# Patient Record
Sex: Female | Born: 1990 | Race: Black or African American | Hispanic: No | Marital: Single | Smoking: Never smoker
Health system: Southern US, Community
[De-identification: ages and names within clinical notes are randomized; demographics above are authoritative.]

## PROBLEM LIST (undated history)

## (undated) ENCOUNTER — Inpatient Hospital Stay (HOSPITAL_COMMUNITY): Payer: Self-pay

## (undated) DIAGNOSIS — Z789 Other specified health status: Secondary | ICD-10-CM

## (undated) DIAGNOSIS — O039 Complete or unspecified spontaneous abortion without complication: Secondary | ICD-10-CM

## (undated) HISTORY — DX: Complete or unspecified spontaneous abortion without complication: O03.9

## (undated) HISTORY — PX: NO PAST SURGERIES: SHX2092

## (undated) HISTORY — PX: CERVICAL CERCLAGE: SHX1329

---
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
## 2008-03-22 ENCOUNTER — Inpatient Hospital Stay (HOSPITAL_COMMUNITY): Admission: AD | Admit: 2008-03-22 | Discharge: 2008-03-23 | Payer: Self-pay | Admitting: Obstetrics & Gynecology

## 2008-04-17 ENCOUNTER — Inpatient Hospital Stay (HOSPITAL_COMMUNITY): Admission: AD | Admit: 2008-04-17 | Discharge: 2008-04-17 | Payer: Self-pay | Admitting: Obstetrics and Gynecology

## 2008-05-05 ENCOUNTER — Observation Stay (HOSPITAL_COMMUNITY): Admission: AD | Admit: 2008-05-05 | Discharge: 2008-05-06 | Payer: Self-pay | Admitting: Obstetrics and Gynecology

## 2008-05-15 ENCOUNTER — Encounter (INDEPENDENT_AMBULATORY_CARE_PROVIDER_SITE_OTHER): Payer: Self-pay | Admitting: Obstetrics and Gynecology

## 2008-05-15 ENCOUNTER — Inpatient Hospital Stay (HOSPITAL_COMMUNITY): Admission: AD | Admit: 2008-05-15 | Discharge: 2008-05-15 | Payer: Self-pay | Admitting: Obstetrics and Gynecology

## 2008-05-18 ENCOUNTER — Inpatient Hospital Stay (HOSPITAL_COMMUNITY): Admission: AD | Admit: 2008-05-18 | Discharge: 2008-05-18 | Payer: Self-pay | Admitting: Obstetrics and Gynecology

## 2008-08-10 ENCOUNTER — Inpatient Hospital Stay (HOSPITAL_COMMUNITY): Admission: AD | Admit: 2008-08-10 | Discharge: 2008-08-11 | Payer: Self-pay | Admitting: Obstetrics

## 2008-08-23 ENCOUNTER — Inpatient Hospital Stay (HOSPITAL_COMMUNITY): Admission: AD | Admit: 2008-08-23 | Discharge: 2008-08-23 | Payer: Self-pay | Admitting: Obstetrics

## 2008-08-25 ENCOUNTER — Inpatient Hospital Stay (HOSPITAL_COMMUNITY): Admission: AD | Admit: 2008-08-25 | Discharge: 2008-08-25 | Payer: Self-pay | Admitting: Obstetrics

## 2008-09-06 ENCOUNTER — Inpatient Hospital Stay (HOSPITAL_COMMUNITY): Admission: AD | Admit: 2008-09-06 | Discharge: 2008-09-06 | Payer: Self-pay | Admitting: Obstetrics

## 2008-09-07 ENCOUNTER — Ambulatory Visit (HOSPITAL_COMMUNITY): Admission: RE | Admit: 2008-09-07 | Discharge: 2008-09-07 | Payer: Self-pay | Admitting: Obstetrics

## 2008-09-14 ENCOUNTER — Observation Stay (HOSPITAL_COMMUNITY): Admission: AD | Admit: 2008-09-14 | Discharge: 2008-09-15 | Payer: Self-pay | Admitting: Obstetrics

## 2008-09-24 ENCOUNTER — Inpatient Hospital Stay (HOSPITAL_COMMUNITY): Admission: AD | Admit: 2008-09-24 | Discharge: 2008-09-24 | Payer: Self-pay | Admitting: Obstetrics

## 2008-10-10 ENCOUNTER — Inpatient Hospital Stay (HOSPITAL_COMMUNITY): Admission: AD | Admit: 2008-10-10 | Discharge: 2008-10-10 | Payer: Self-pay | Admitting: Obstetrics

## 2008-10-20 ENCOUNTER — Inpatient Hospital Stay (HOSPITAL_COMMUNITY): Admission: AD | Admit: 2008-10-20 | Discharge: 2008-10-20 | Payer: Self-pay | Admitting: Obstetrics

## 2008-10-25 ENCOUNTER — Inpatient Hospital Stay (HOSPITAL_COMMUNITY): Admission: AD | Admit: 2008-10-25 | Discharge: 2008-10-25 | Payer: Self-pay | Admitting: Obstetrics

## 2008-10-30 ENCOUNTER — Inpatient Hospital Stay (HOSPITAL_COMMUNITY): Admission: AD | Admit: 2008-10-30 | Discharge: 2008-11-15 | Payer: Self-pay | Admitting: Obstetrics

## 2008-10-30 ENCOUNTER — Ambulatory Visit: Payer: Self-pay | Admitting: Advanced Practice Midwife

## 2008-11-13 ENCOUNTER — Encounter: Payer: Self-pay | Admitting: Obstetrics & Gynecology

## 2009-10-07 IMAGING — US US ABDOMEN COMPLETE
1 series · 14 of 25 positions shown · non-contrast
Comparison: None

CLINICAL DATA: 14 weeks gestational age.  Food poisoning or stomach
virus.  Nausea, vomiting.  Rule out gallbladder.  Abdominal pain.

ABDOMEN ULTRASOUND
TECHNIQUE: Complete abdominal ultrasound examination was performed
including evaluation of the liver, gallbladder, bile ducts,
pancreas, kidneys, spleen, IVC, and abdominal aorta.

[Series 1: us abdomen complete · 14 of 56 slices shown]
[im 1/56]
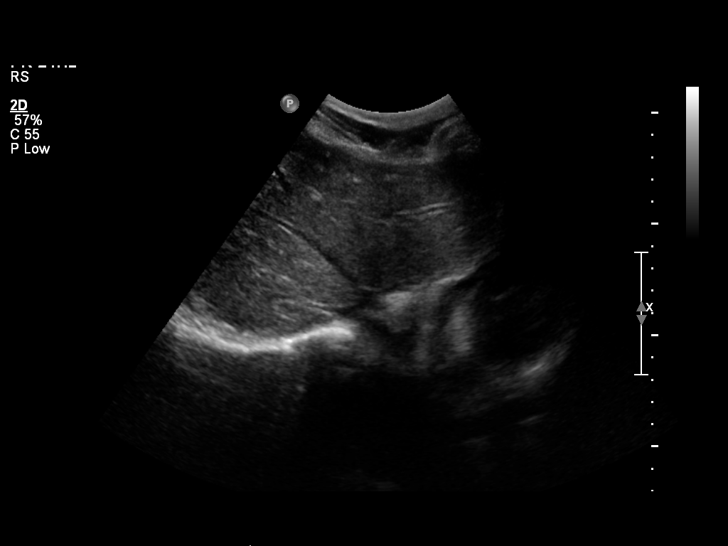
[im 5/56]
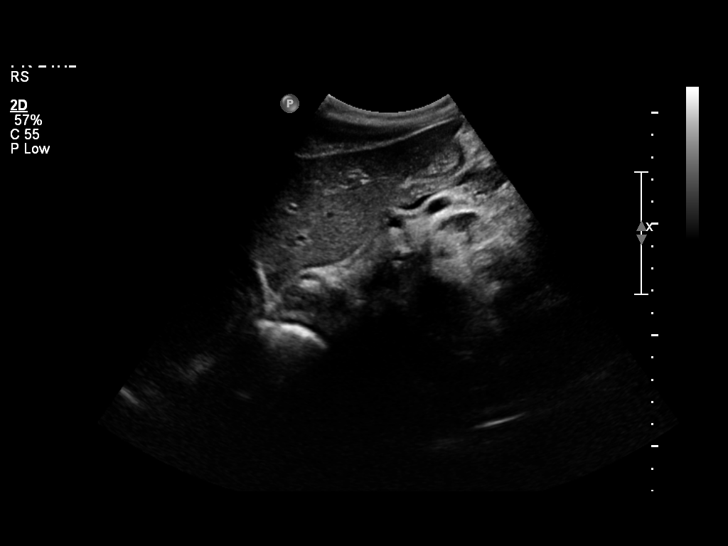
[im 10/56]
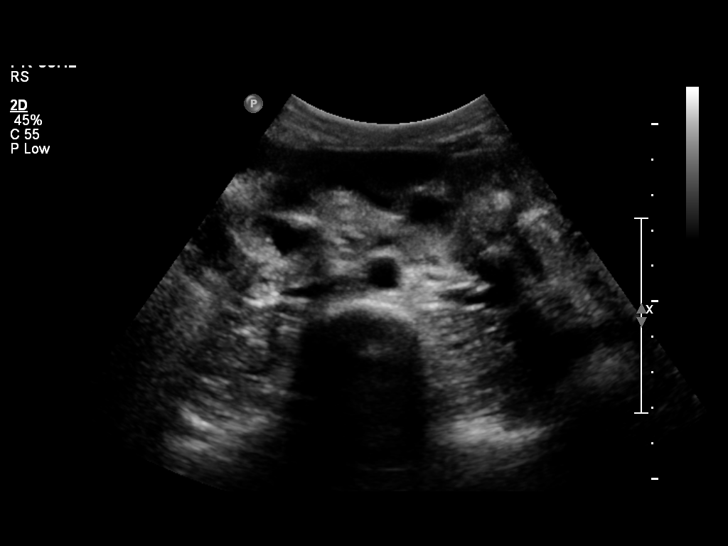
[im 14/56]
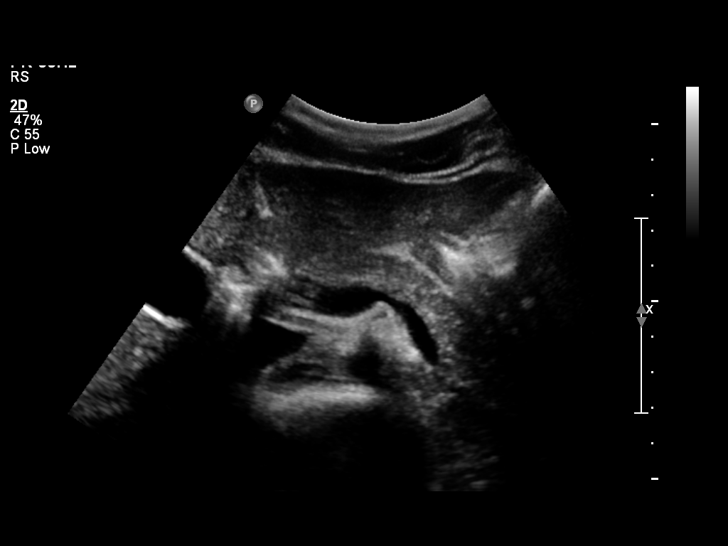
[im 19/56]
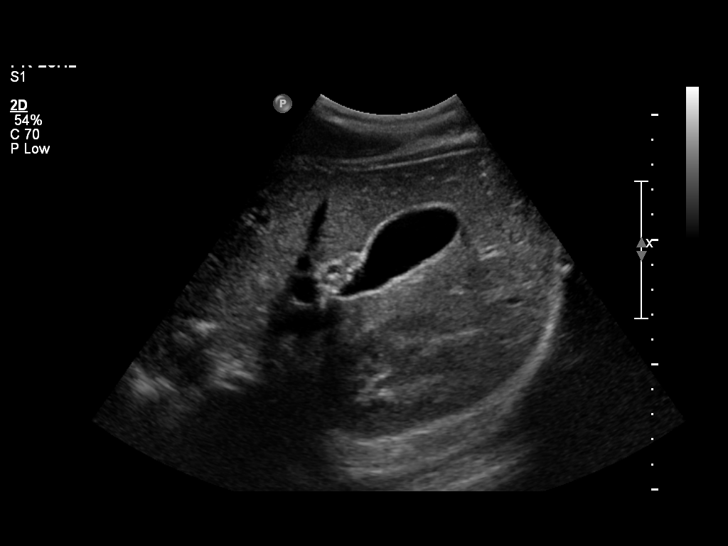
[im 21/56]
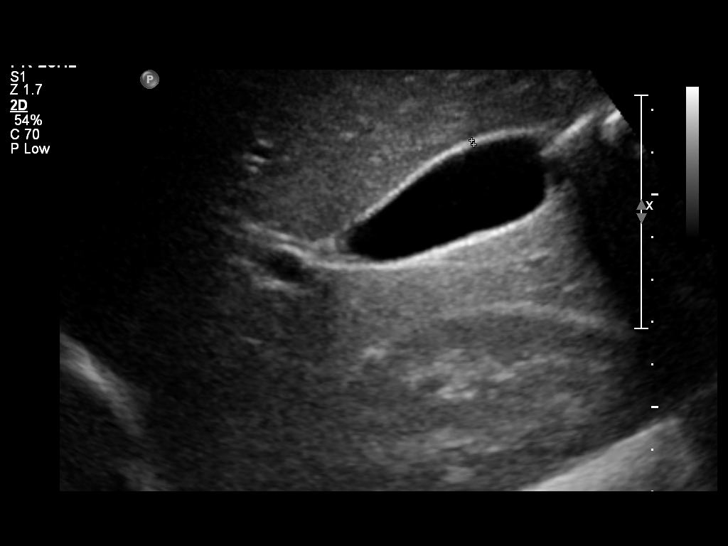
[im 26/56]
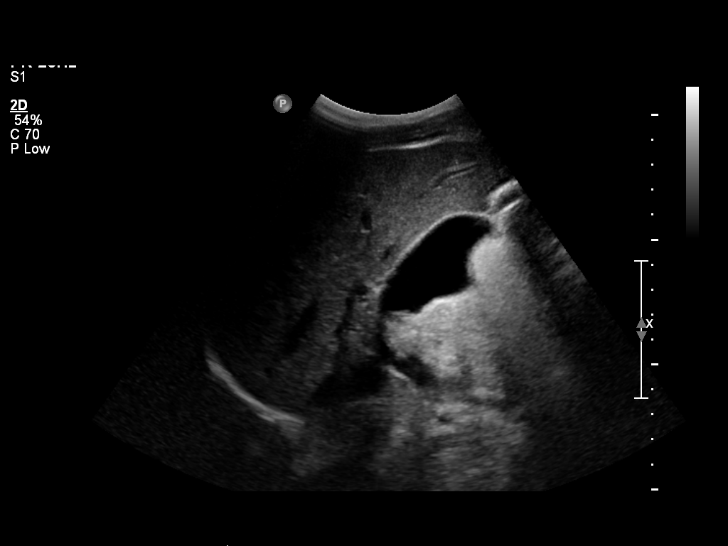
[im 30/56]
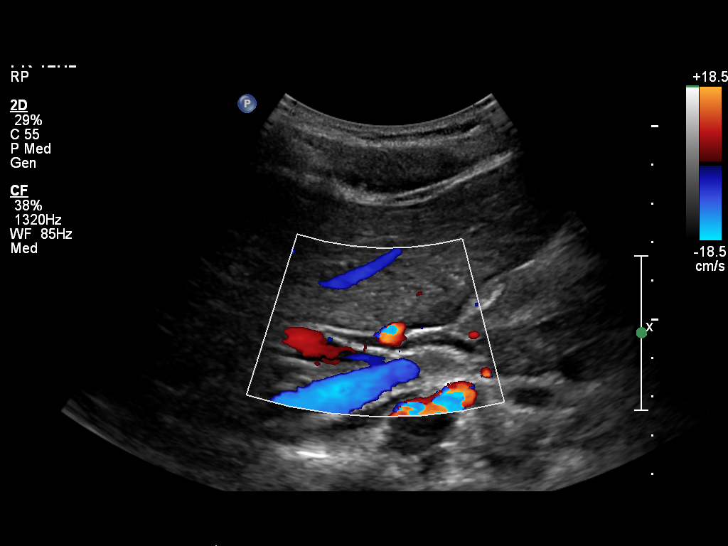
[im 35/56]
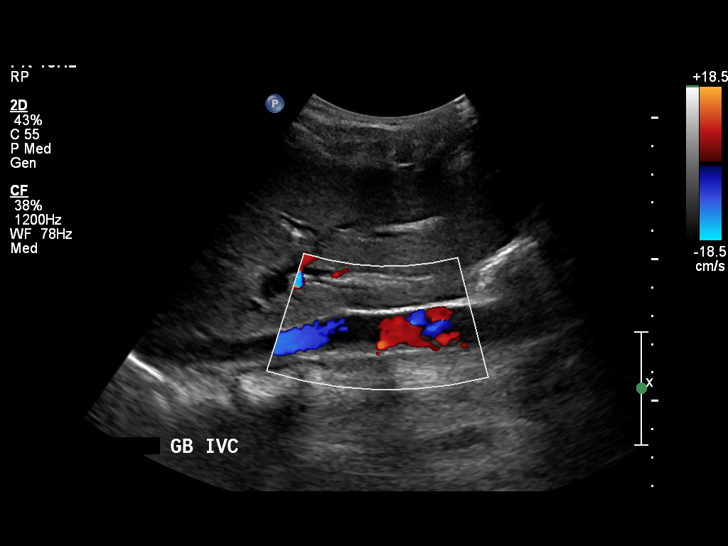
[im 37/56]
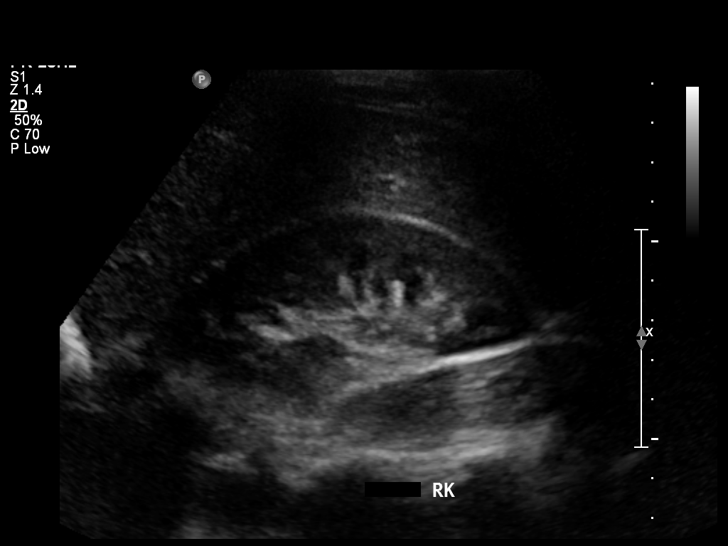
[im 42/56]
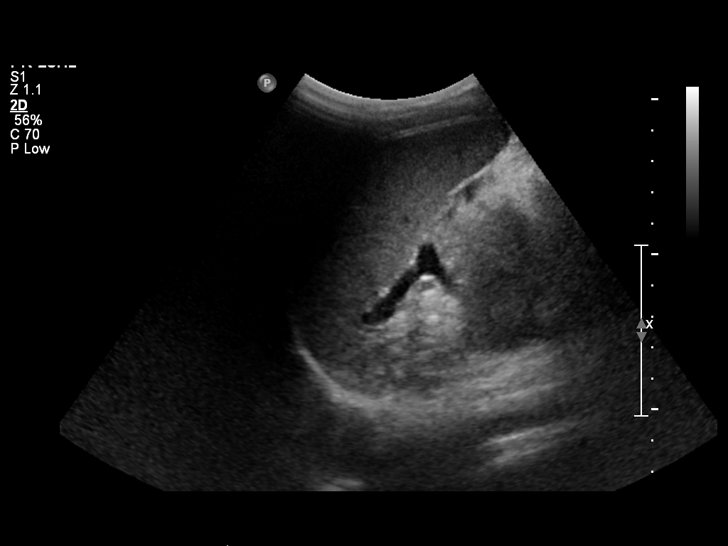
[im 46/56]
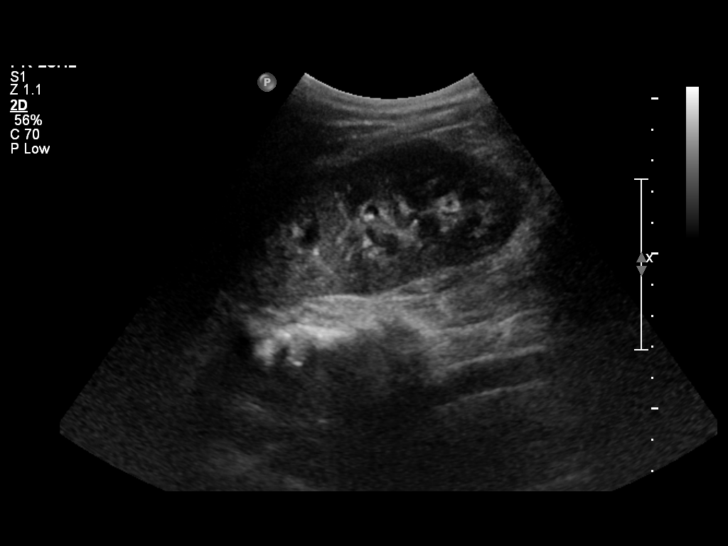
[im 51/56]
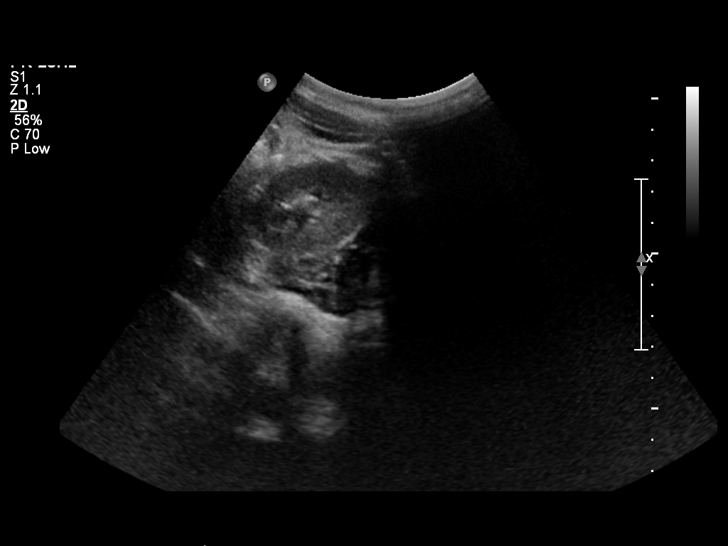
[im 56/56]
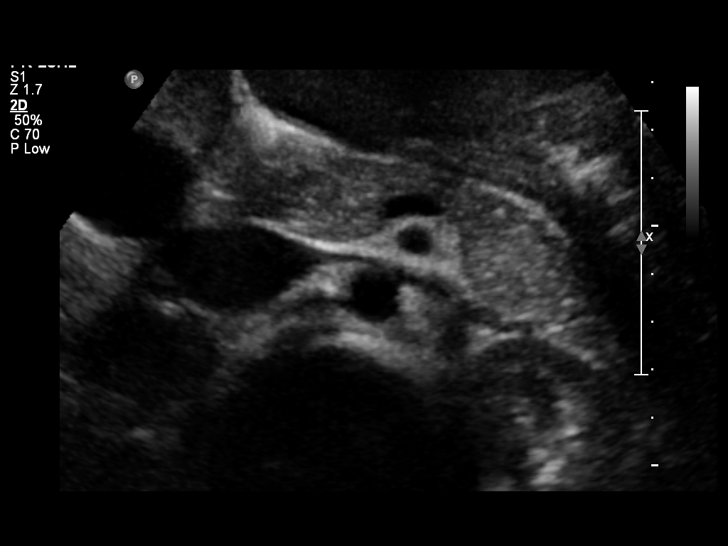

[14 of 25 positions shown; findings below may reference images not displayed]

FINDINGS: The liver is homogeneous in echotexture without mass.
There is no intra or extrahepatic biliary ductal dilatation.  The
gallbladder has a normal appearance.

The inferior vena cava and visualized abdominal aorta have normal
appearance.  The pancreas, spleen, and kidneys have normal
appearance.  No hydronephrosis is noted.
IMPRESSION: No evidence for acute abnormality of the abdomen.

## 2009-11-21 IMAGING — US US OB TRANSVAGINAL
1 series · 14 of 19 positions shown · non-contrast
Comparison: none

OBSTETRICAL ULTRASOUND:
 This ultrasound exam was performed in the [HOSPITAL] Ultrasound Department.  The OB US report was generated in the AS system, and faxed to the ordering physician.  This report is also available in [REDACTED] PACS.

[Series 1: us ob transvaginal · 14 of 19 slices shown]
[im 1/19]
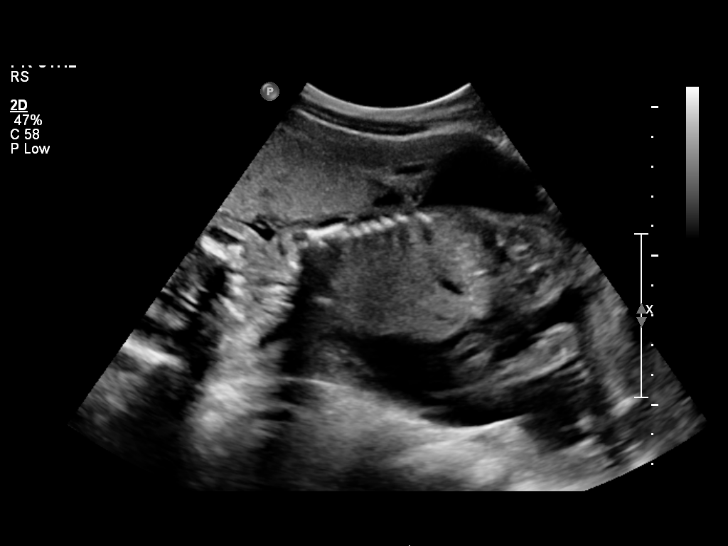
[im 3/19]
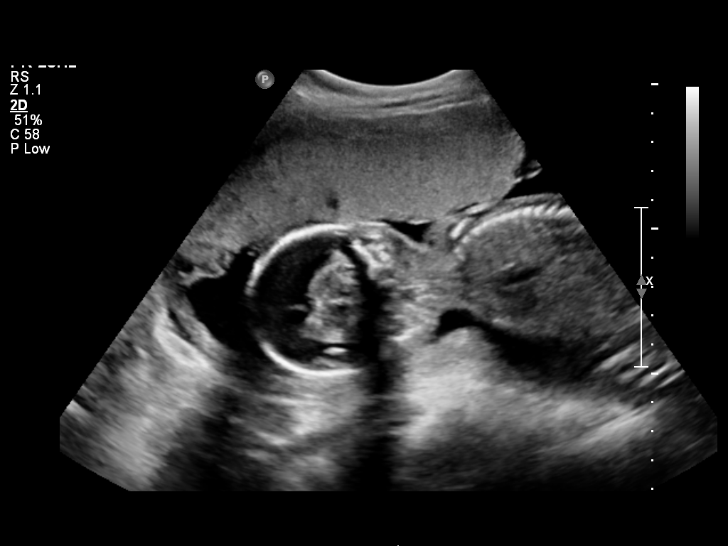
[im 4/19]
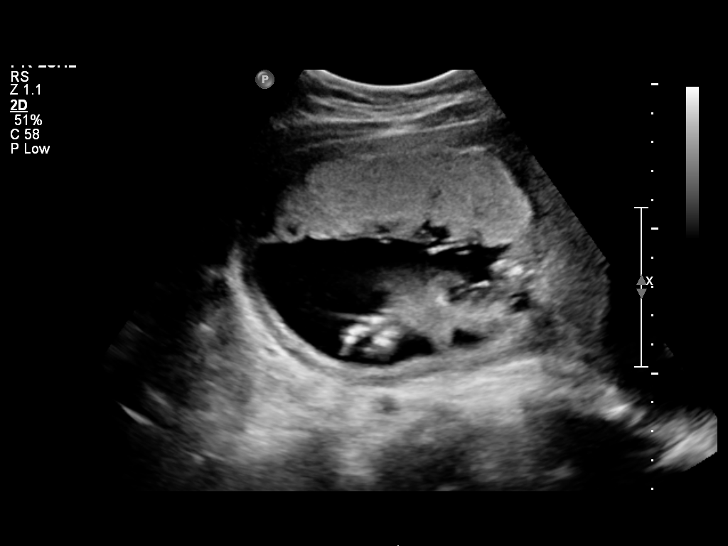
[im 5/19]
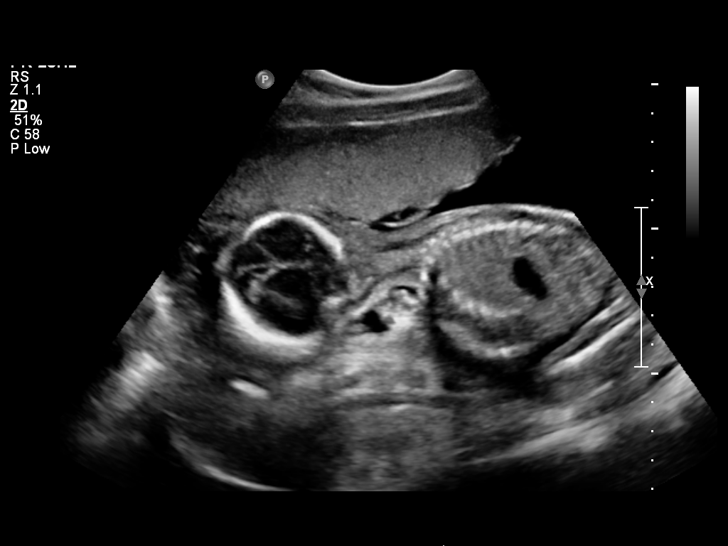
[im 7/19]
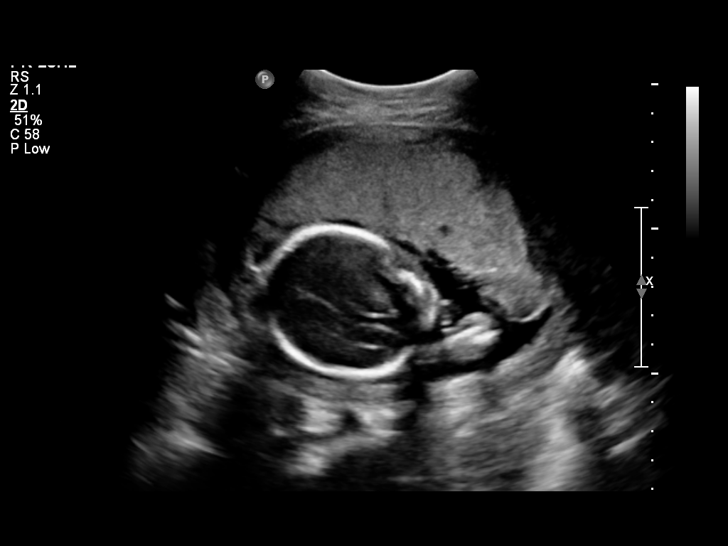
[im 8/19]
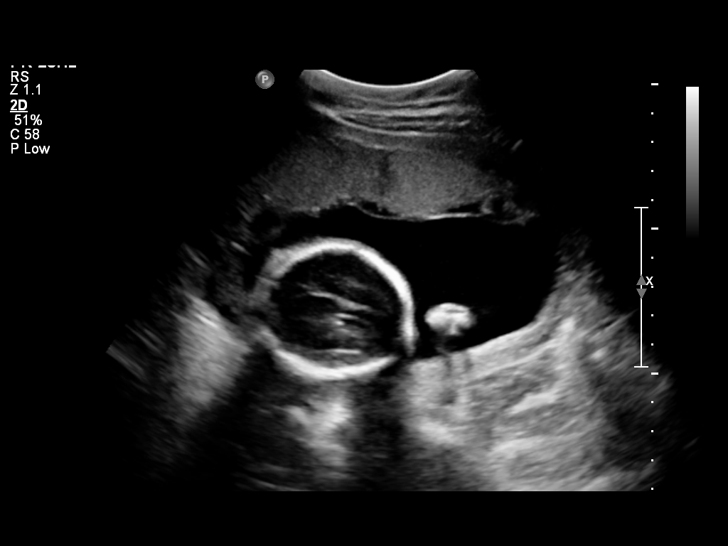
[im 9/19]
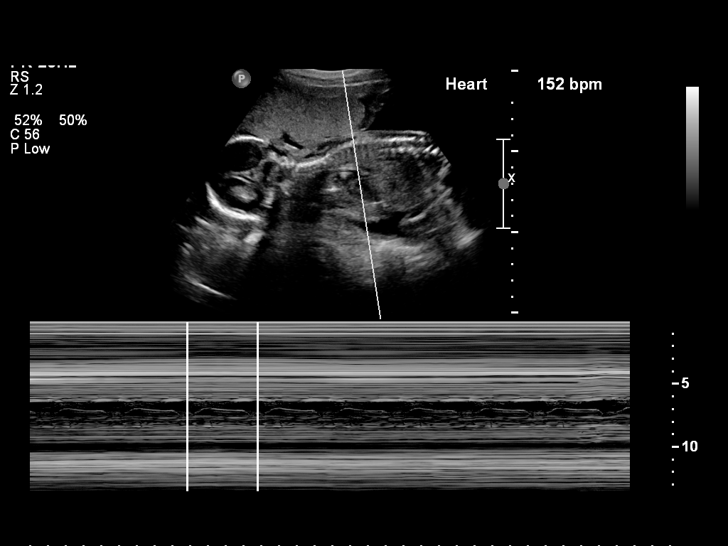
[im 11/19]
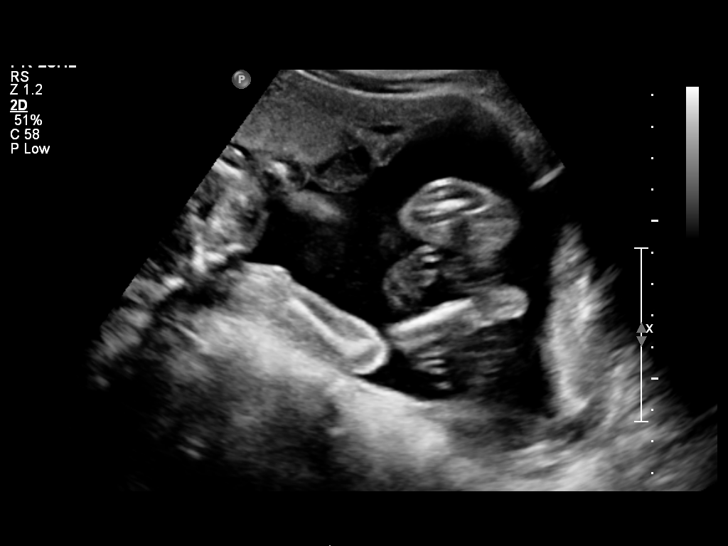
[im 12/19]
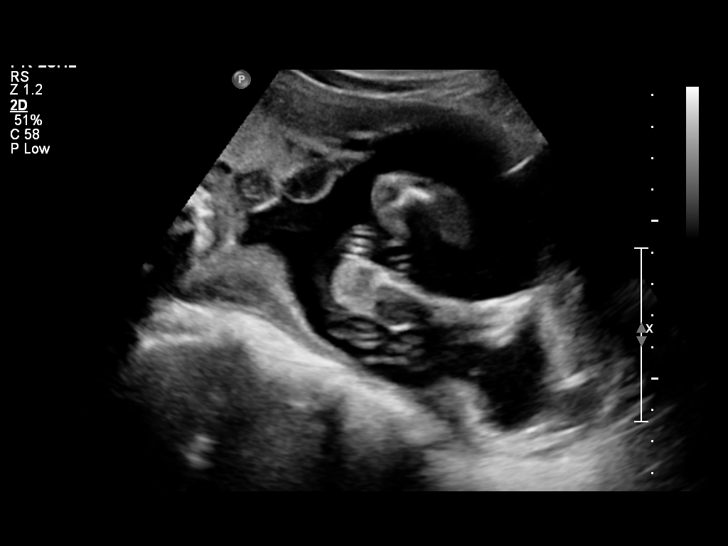
[im 13/19]
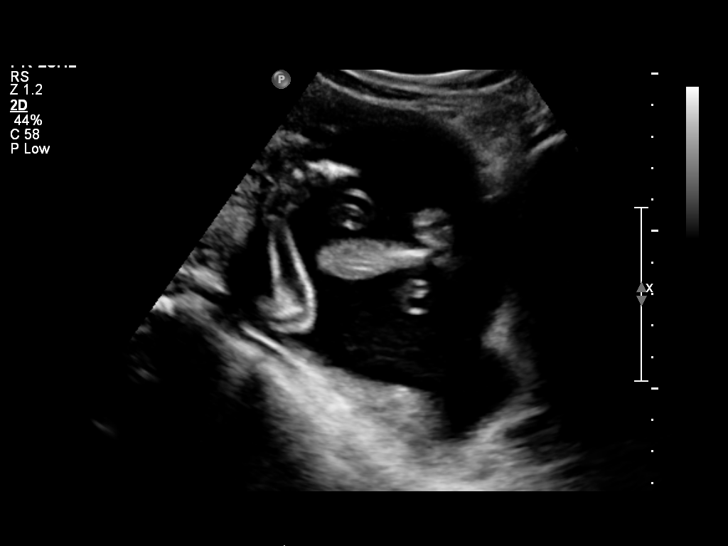
[im 15/19]
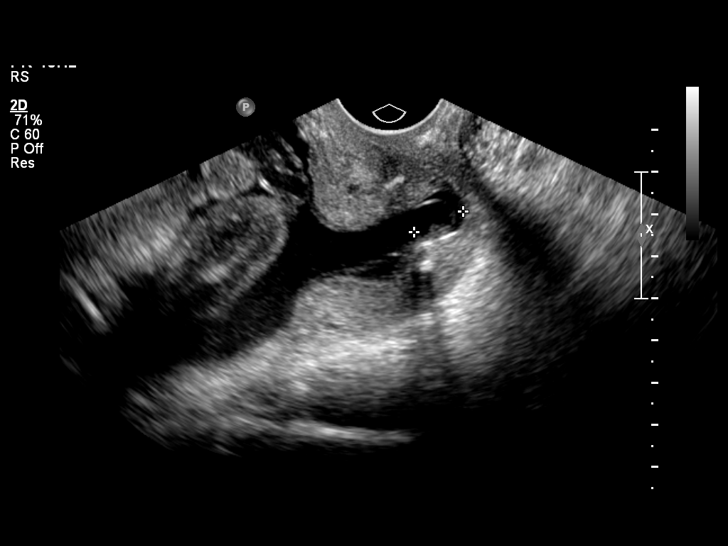
[im 16/19]
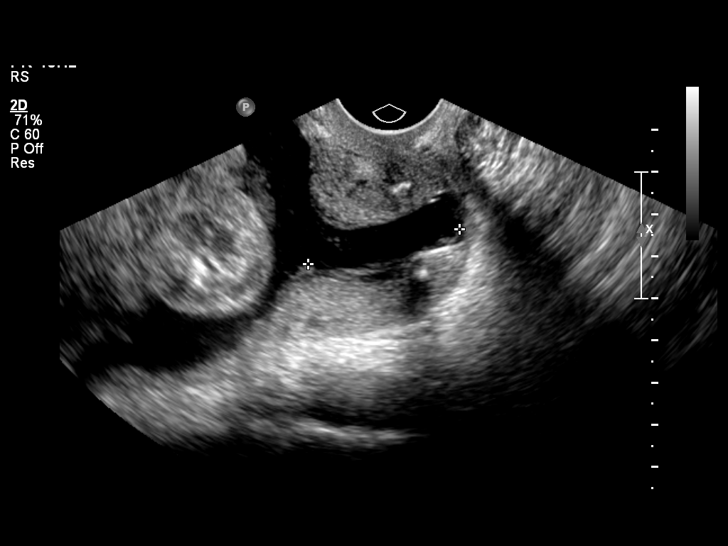
[im 17/19]
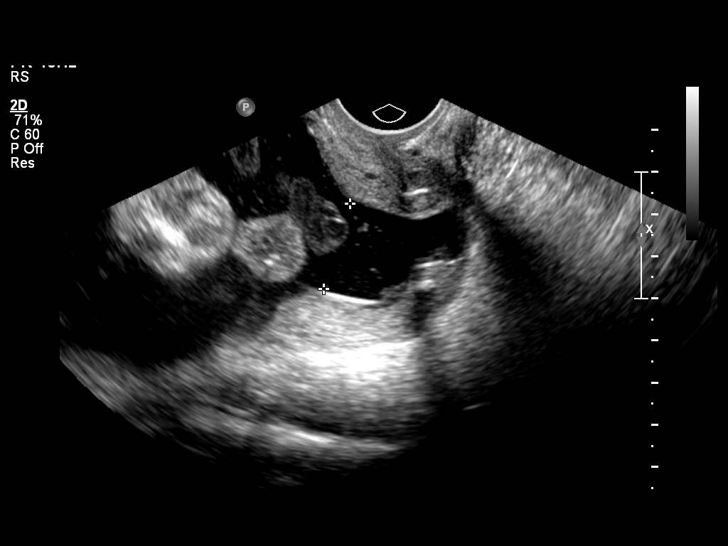
[im 19/19]
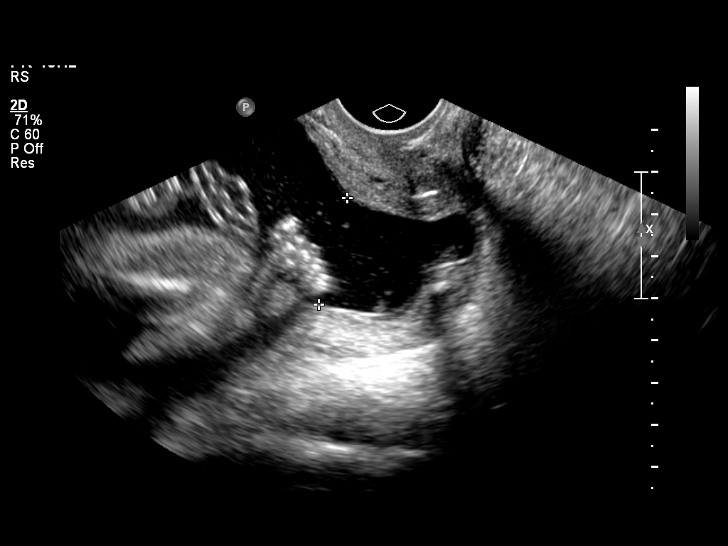

[14 of 19 positions shown; findings below may reference images not displayed]

IMPRESSION: See AS Obstetric US report.

## 2010-02-14 ENCOUNTER — Emergency Department (HOSPITAL_COMMUNITY): Admission: EM | Admit: 2010-02-14 | Discharge: 2010-02-14 | Payer: Self-pay | Admitting: Family Medicine

## 2010-02-22 ENCOUNTER — Emergency Department (HOSPITAL_COMMUNITY): Admission: EM | Admit: 2010-02-22 | Discharge: 2010-02-22 | Payer: Self-pay | Admitting: Emergency Medicine

## 2010-03-22 ENCOUNTER — Emergency Department (HOSPITAL_COMMUNITY): Admission: EM | Admit: 2010-03-22 | Discharge: 2010-03-22 | Payer: Self-pay | Admitting: Family Medicine

## 2010-12-02 LAB — WET PREP, GENITAL: Clue Cells Wet Prep HPF POC: NONE SEEN

## 2010-12-02 LAB — POCT PREGNANCY, URINE: Preg Test, Ur: NEGATIVE

## 2010-12-02 LAB — POCT URINALYSIS DIP (DEVICE)
Ketones, ur: NEGATIVE mg/dL
Protein, ur: NEGATIVE mg/dL
Specific Gravity, Urine: 1.02 (ref 1.005–1.030)

## 2010-12-03 LAB — POCT URINALYSIS DIP (DEVICE)
Glucose, UA: NEGATIVE mg/dL
Hgb urine dipstick: NEGATIVE
Nitrite: NEGATIVE
Protein, ur: NEGATIVE mg/dL
Urobilinogen, UA: 0.2 mg/dL (ref 0.0–1.0)
pH: 5.5 (ref 5.0–8.0)

## 2010-12-03 LAB — WET PREP, GENITAL
Clue Cells Wet Prep HPF POC: NONE SEEN
Clue Cells Wet Prep HPF POC: NONE SEEN
Yeast Wet Prep HPF POC: NONE SEEN

## 2010-12-03 LAB — URINE MICROSCOPIC-ADD ON

## 2010-12-03 LAB — GC/CHLAMYDIA PROBE AMP, GENITAL
Chlamydia, DNA Probe: NEGATIVE
Chlamydia, DNA Probe: NEGATIVE
GC Probe Amp, Genital: NEGATIVE

## 2010-12-03 LAB — URINALYSIS, ROUTINE W REFLEX MICROSCOPIC
Nitrite: NEGATIVE
Protein, ur: NEGATIVE mg/dL
Specific Gravity, Urine: 1.028 (ref 1.005–1.030)
pH: 6 (ref 5.0–8.0)

## 2010-12-03 LAB — POCT PREGNANCY, URINE
Preg Test, Ur: NEGATIVE
Preg Test, Ur: NEGATIVE

## 2010-12-03 LAB — RPR
RPR Ser Ql: NONREACTIVE
RPR Ser Ql: NONREACTIVE

## 2010-12-27 LAB — CBC
Hemoglobin: 11 g/dL — ABNORMAL LOW (ref 12.0–15.0)
MCHC: 32.7 g/dL (ref 30.0–36.0)
MCV: 79.2 fL (ref 78.0–100.0)
RBC: 4.23 MIL/uL (ref 3.87–5.11)
RDW: 14.5 % (ref 11.5–15.5)

## 2010-12-31 LAB — URINALYSIS, ROUTINE W REFLEX MICROSCOPIC
Bilirubin Urine: NEGATIVE
Glucose, UA: NEGATIVE mg/dL
Glucose, UA: NEGATIVE mg/dL
Hgb urine dipstick: NEGATIVE
Hgb urine dipstick: NEGATIVE
Ketones, ur: NEGATIVE mg/dL
Protein, ur: NEGATIVE mg/dL
Specific Gravity, Urine: 1.01 (ref 1.005–1.030)
Urobilinogen, UA: 0.2 mg/dL (ref 0.0–1.0)
Urobilinogen, UA: 0.2 mg/dL (ref 0.0–1.0)
pH: 8 (ref 5.0–8.0)

## 2010-12-31 LAB — WET PREP, GENITAL
Clue Cells Wet Prep HPF POC: NONE SEEN
Trich, Wet Prep: NONE SEEN
Yeast Wet Prep HPF POC: NONE SEEN

## 2010-12-31 LAB — GC/CHLAMYDIA PROBE AMP, GENITAL: GC Probe Amp, Genital: NEGATIVE

## 2011-01-01 LAB — CBC
HCT: 33.2 % — ABNORMAL LOW (ref 36.0–46.0)
MCHC: 32.7 g/dL (ref 30.0–36.0)
MCV: 78.8 fL (ref 78.0–100.0)
Platelets: 202 10*3/uL (ref 150–400)
WBC: 11.2 10*3/uL — ABNORMAL HIGH (ref 4.0–10.5)

## 2011-01-01 LAB — DIFFERENTIAL
Basophils Relative: 1 % (ref 0–1)
Eosinophils Absolute: 0.1 10*3/uL (ref 0.0–0.7)
Eosinophils Relative: 1 % (ref 0–5)
Lymphs Abs: 1.9 10*3/uL (ref 0.7–4.0)
Neutrophils Relative %: 75 % (ref 43–77)

## 2011-01-01 LAB — WET PREP, GENITAL
Trich, Wet Prep: NONE SEEN
Yeast Wet Prep HPF POC: NONE SEEN

## 2011-01-01 LAB — MAGNESIUM: Magnesium: 7 mg/dL (ref 1.5–2.5)

## 2011-01-29 NOTE — Op Note (Signed)
[name], [name]                 ACCOUNT NO.:  [id number]   MEDICAL RECORD NO.:  [id number]          PATIENT TYPE:  INP   LOCATION:  9320                          FACILITY:  [facility]   PHYSICIAN:  Roseanna Rainbow, M.D.DATE OF BIRTH:  Jul 25, 1991   DATE OF PROCEDURE:  11/13/2008  DATE OF DISCHARGE:                               OPERATIVE REPORT   PREOPERATIVE DIAGNOSES:  Intrauterine pregnancy at 23 plus weeks with  cerclage in situ, advanced preterm labor.   POSTOPERATIVE DIAGNOSES:  Intrauterine pregnancy at 23 plus weeks with  cerclage in situ, advanced preterm labor.   PROCEDURE:  Cerclage removal under anesthesia.   SURGEON:  Roseanna Rainbow, MD   ANESTHESIA:  Epidural.   IV FLUID:  As per Anesthesiology.   COMPLICATIONS:  None.   ESTIMATED BLOOD LOSS:  100 mL.   FINDINGS:  The cerclage knot was well visualized at 5 o'clock.   PROCEDURE:  The patient was taken to the operating room with an IV  running.  An epidural catheter was placed.  After a time-out had been  completed, a weighted speculum was placed in the patient's vagina.  The  membranes were hourglassing in the os. Deaver was placed into the  vagina.  A sponge forceps with throat pack on the end was then used to  push up on the membranes.  The patient was also placed  on  Trendelenburg.  The cerclage knot was identified at 5 o'clock.  It was  grasped superiorly and  the loop was transected.  A digital exam was  then performed, the cervix was approximately 5 cm dilated with the  vertex at the os.  The cervix was palpated.  No Mersilene tape  was detected and it was felt the cerclage had been completely removed.  All the instruments were then removed from the vagina.  Please note that  the heart rate had been auscultated with the external monitor before and  after the procedure and were in the 140 beats per minute range.      Roseanna Rainbow, M.D.  Electronically Signed     LAJ/MEDQ  D:   11/13/2008  T:  11/14/2008  Job:  [id number]   cc:   Kathreen Cosier, M.D.  Fax: [phone]

## 2011-01-29 NOTE — Op Note (Signed)
NAME:  [name], [name]                 ACCOUNT NO.:  [id number]   MEDICAL RECORD NO.:  [id number]          PATIENT TYPE:  AMB   LOCATION:  SDC                           [facility]ILITY:  [facility]   PHYSICIAN:  Kathreen Cosier, M.D.DATE OF BIRTH:  15-Sep-1991   DATE OF PROCEDURE:  09/07/2008  DATE OF DISCHARGE:                               OPERATIVE REPORT   PREOPERATIVE DIAGNOSIS:  Incompetent cervix.   PROCEDURE:  Placement of a cervical cerclage.   After the spinal was administered with the patient in lithotomy  position, perineum and vagina prepped and draped.  Bladder emptied with  straight catheter.  Weighted speculum placed in the vagina and starting  at 12 o'clock using a #5 Mersilene band, suture was placed and then tied  and 5 o'clock.  The patient tolerated the procedure well and taken to  recovery room in good condition.           ______________________________  Kathreen Cosier, M.D.     BAM/MEDQ  D:  09/07/2008  T:  09/07/2008  Job:  [id number]

## 2011-01-29 NOTE — Discharge Summary (Signed)
[name], [name]                 ACCOUNT NO.:  [id number]   MEDICAL RECORD NO.:  [id number]          PATIENT TYPE:  INP   LOCATION:  9318                          [facility]ILITY:  [facility]   PHYSICIAN:  Sherron Monday, MD        DATE OF BIRTH:  [birth year]   DATE OF ADMISSION:  05/15/2008  DATE OF DISCHARGE:  05/15/2008                               DISCHARGE SUMMARY   ADMITTING DIAGNOSES:  Intrauterine pregnancy at 20 weeks, failed  cerclage.   DISCHARGE DIAGNOSES:  Intrauterine pregnancy at 20 weeks, failed  cerclage, delivered via spontaneous vaginal delivery.   HISTORY OF PRESENT ILLNESS:  An 20 year old G2, P0-0-1-0, at 20 plus 2  weeks with cerclage with pain for approximately 2 hours similar to  menstrual cramping.  States she has had small spotting, good fetal  movement.  No loss of fluid.  A cerclage was placed May 06, 2008,  secondary to history of incompetent cervix and a shortening cervix.  Her  prenatal care to this point had been uncomplicated except by a previous  16-week PPROM and loss.   PAST MEDICAL HISTORY:  Nonsignificant.   PAST SURGICAL HISTORY:  Significant for the cerclage placement.   PAST OB/GYN HISTORY:  G1 16 week SAB secondary to incompetent cervix due  to its present pregnancy.  No history of any abnormal Pap smears,  sexually transmitted diseases including chlamydia with this pregnancy.   MEDICATIONS:  Include prenatal vitamins.   ALLERGIES:  No known drug allergies.   SOCIAL HISTORY:  The patient denies alcohol, tobacco, or drug use and is  single.   FAMILY HISTORY:  Significant for mother with hypertension.  No diabetes.  No cancer and no heart disease.   PHYSICAL EXAMINATION:  On admission, she is afebrile.  Vital signs  stable and very uncomfortable.  Her abdomen was soft, with  midline  cramping, and good bowel sounds on exam.  Normal external vulva and  genitalia.  Normal Bartholin, urethra, and Skene glands.  Cervix was  dilated at 2-4 cm and  membranes were noted at the cervical os with fetal  parts.   PRENATAL LABS:  Hemoglobin 12.5 and platelets 251,000.  B positive.  MSU  negative. Glucola within normal limits. Gonorrhea negative.  Chlamydia  negative.  RPR nonreactive.  Rubella immune.  Hepatitis B surface  antigen negative.  HIV negative.  Her first trimester screen within  limits.  On time of admission, urinalysis was negative.  Ultrasound that  had been performed at 19 weeks revealed normal anatomy, anterior  placenta, and a shortened cervix.  She was admitted and had a CBC  checked and revealed hemoglobin level of 11.5.  She was given  IV fluids  and pain medicine as she was having increasing pain.  Her cervix is  checked again at this time.  Membranes were noted to be bulging through  the cervical os, and in the vagina and the pregnancy was noted to be in  the bulging membranes.  She is given 4 mg of Morphine for discomfort and  Phenergan for nausea.  She  is also having nausea and vomiting.  I  described with the patient what was happening and the pregnancy was not  able to be continued.  Plan for expectant management.  The plan was to  clip her cerclage stitch however this was unable to be done prior to  delivery.  The patient at approximately 4:30 a.m. had increasing pain.  Membranes were noted at the os and into the vagina.  The patient pushed  for 3-4 times for delivery en caul, and placenta was then inspected,  thought to be complete and sent to pathology.  Apgars were noted to be  2, 1, and 1.  The delivery was at 4:48, death was at 6:02.  EBL less  than 500 mL.  No lacerations were noted.  Delivery was of a nonviable  female infant.  The patient requested an autopsy.  A second exam was  performed after delivery to remove the cerclage.  Of note, verified a  single stitch without difficulty or complications.  Of interest, the  cervix was not noted to have any lacerations.  Sutures removed in 1  piece.   Ultrasound was performed to verify no retained products.  As it  could not confirm retained products, the patient was given bleeding and  pain precautions.  She was discharged to home in the afternoon of  postpartum day #0 at her request.  Her bleeding was well controlled.  Her pain was well controlled.  She is discharged after a social work  consult.  She was discharged to home with the prescriptions for Motrin  and Vicodin, and she will follow up in the office in approximately 2-3  weeks.      Sherron Monday, MD  Electronically Signed     JB/MEDQ  D:  05/15/2008  T:  05/16/2008  Job:  [id number]

## 2011-01-29 NOTE — Op Note (Signed)
[name], [name]                 ACCOUNT NO.:  [id number]   MEDICAL RECORD NO.:  [id number]          PATIENT TYPE:  OBV   LOCATION:  9304                          [facility]ACILITY:  [facility]   PHYSICIAN:  Zenaida Niece, M.D.DATE OF BIRTH:  [birth year]   DATE OF PROCEDURE:  05/06/2008  DATE OF DISCHARGE:  05/05/2008                               OPERATIVE REPORT   PREOPERATIVE DIAGNOSIS:  Intrauterine pregnancy at 19 weeks with an  incompetent cervix.   POSTOPERATIVE DIAGNOSIS:  Intrauterine pregnancy at 19 weeks with an  incompetent cervix.   PROCEDURE:  McDonald cervical cerclage.   SURGEON:  Zenaida Niece, MD   ANESTHESIA:  Spinal.   FINDINGS:  Cervix was 1-2 cm dilated with 2-3 cm of cervical length.  Fetal membranes were visible approximately 1 cm above the external os.   SPECIMENS:  None.   ESTIMATED BLOOD LOSS:  50 mL.   COMPLICATIONS:  None.   PROCEDURE IN DETAIL:  The patient was taken to the operating room and  placed in a sitting position.  Dr. Malen Gauze and the CRNA student instilled  spinal anesthesia.  She was then placed in the dorsal supine position  and legs were placed in mobile stirrups.  Perineum was prepped with  Betadine and her previously placed Foley catheter was removed.  She was  draped sterilely.  A weighted speculum was inserted into the vagina and  the vagina was washed with warm saline solution.  A right-angle  retractor was used anteriorly.  Cervical exam revealed the above-  mentioned findings.  The cervix was grasped with ring forceps and a  cerclage of #1 Prolene was placed in a counterclockwise fashion starting  at 12 o'clock.  This achieved 5 bites of cervix going around  counterclockwise and left about a little bit more than a centimeter of  cervix.  This achieved good cervical closure when the knot was tied  anteriorly.  On exam after doing this, the cervix was closed and again  had 1-2 cm of length.  The cervix and vagina were then washed  again with  normal saline and all instruments were removed from the vagina.  The  patient tolerated the procedure well.  She was taken down from the  stirrups and taken to the recovery room in stable condition.  Counts  were correct and she received Ancef 1 g IV at the beginning of the  procedure.      Zenaida Niece, M.D.  Electronically Signed    TDM/MEDQ  D:  05/06/2008  T:  05/07/2008  Job:  [id number]

## 2011-01-29 NOTE — H&P (Signed)
[name], [name]                 ACCOUNT NO.:  [id number]   MEDICAL RECORD NO.:  [id number]          PATIENT TYPE:  OBV   LOCATION:  9304                          [facility]   PHYSICIAN:  Zenaida Niece, M.D.DATE OF BIRTH:  1991-01-25   DATE OF ADMISSION:  05/05/2008  DATE OF DISCHARGE:                              HISTORY & PHYSICAL   CHIEF COMPLAINT:  Incompetent cervix.   HISTORY OF PRESENT ILLNESS:  This is an 20 year old black female,  gravida 2, para 0-1-0-0 with an EGA of [redacted] weeks by an LMP, consistent  with an 8-week ultrasound with a due date of September 28, 2008.  The  patient started care here at 10 weeks.  Her previous pregnancy, the  patient reports she was approximately 16 weeks and had fluid leakage.  She was seen at the hospital and ruptured membranes were confirmed, and  she was 2-cm with fetal parts at the cervical os.  Review of her  hospital records reveal that she was 17 weeks.  We discussed the fact  that it was difficult to tell if this was due to an incompetent cervix  or something else.  She has been followed with serial ultrasounds to  check her cervix.  At 15 weeks, the cervix measured 4.7 cm, and her most  recent ultrasound prior to today was on April 29, 2008, and at that  visit the cervix still measured 4.17 cm.  However, on ultrasound today  for anatomy, her cervix measures 1.86 cm and is funneled to the external  os with membranes at the external os.  Exam is consistent with an  incompetent cervix, and she is being admitted for bedrest and cervical  cerclage.   PRENATAL LABS:  Blood type is B+ with a negative antibody screen.  RPR  nonreactive, rubella immune, hepatitis B surface antigen negative, HIV  negative, gonorrhea and chlamydia negative.   PAST OBSTETRICAL HISTORY:  Again, the preterm premature rupture of  membranes at 16-17 weeks, with delivery of a stillborn infant.   PAST MEDICAL HISTORY:  Otherwise negative.   PAST SURGICAL  HISTORY:  Negative.   ALLERGIES:  NONE KNOWN.   CURRENT MEDICATIONS:  None.   SOCIAL HISTORY:  Denies alcohol, tobacco or drug use.   FAMILY HISTORY:  Noncontributory.   PHYSICAL EXAMINATION:  VITAL SIGNS:  Weight is 125 pounds.  GENERAL:  This is a gravid female in no acute distress.  NECK:  Supple without lymphadenopathy or thyromegaly.  LUNGS:  Clear to auscultation.  HEART:  Regular rate and rhythm without murmur.  ABDOMEN:  Gravid, nontender.  Cervix is 1+30 and -3, and on speculum  exam, I can see cervical membranes at the external cervical os.   ASSESSMENT:  Intrauterine pregnancy at 19 weeks with a probable  incompetent cervix.  The situation been has been discussed in detail  with the patient.  Again, this is a significant change in her cervix  over the past week.  All options have been discussed with the patient.  Plan is to admit the patient and put her on bedrest.  She will have a  Foley catheter so that she does not have to get out of the bed to the go  to the bathroom.  We will briefly monitor her for contractions to make  sure that this is not an issue.  She will be on bedrest, and I will try  to get her in for a cerclage some time on May 06, 2008.      Zenaida Niece, M.D.  Electronically Signed     TDM/MEDQ  D:  05/05/2008  T:  05/05/2008  Job:  [id number]

## 2011-02-01 NOTE — Discharge Summary (Signed)
NAMEMarland Kitchen  [name], [name]                 ACCOUNT NO.:  [id number]   MEDICAL RECORD NO.:  [id number]          PATIENT TYPE:  INP   LOCATION:  9320                          [facility]ILITY[facility]   PHYSICIAN:  Kathreen Cosier, M.D.DATE OF BIRTH:  14-Dec-1990   DATE OF ADMISSION:  10/30/2008  DATE OF DISCHARGE:  11/15/2008                               DISCHARGE SUMMARY   The patient is a 20 year old gravida 3, para 0-0-2-0, admitted at 48  weeks, EDC on March 11, 2009.  She had a history of incompetent cervix  and had a cerclage in the past.  With this pregnancy, had a cerclage  also.  She was admitted because an ultrasound showed cervix with 0-cm  cervical length.  She was to found to have the length of the cervix and  the patient was admitted and kept in Trendelenburg, and got erythromycin  and ampicillin, and Delalutin 250 IM weekly.  She was also placed on  Procardia.  On November 12, 2008, she had occasional contractions and  she was started on magnesium sulfate 4 g loading, then 2 g and up to 3 g  per hour.  She had a cerclage was removed on November 13, 2008, and she  had a precipitous delivery vaginally of a 1 pound 5 ounces female, which  survived.  The patient did well post delivery and her hemoglobin was 11.  She was discharged on the second postpartum day on Tylenol #3 for pain.   DISCHARGE DIAGNOSES:  1. Status post incompetent cervix.  2. Failed cervical cerclage.  3. Normal vaginal delivery of 1 pound 5 ounce female.           ______________________________  Kathreen Cosier, M.D.     BAM/MEDQ  D:  12/07/2008  T:  12/07/2008  Job:  [id number]

## 2011-06-07 LAB — URINE MICROSCOPIC-ADD ON

## 2011-06-07 LAB — ANAEROBIC CULTURE

## 2011-06-07 LAB — URINALYSIS, ROUTINE W REFLEX MICROSCOPIC
Bilirubin Urine: NEGATIVE
Ketones, ur: NEGATIVE
Specific Gravity, Urine: 1.01
Urobilinogen, UA: 0.2
pH: 7

## 2011-06-07 LAB — CBC
HCT: 34.8 — ABNORMAL LOW
Hemoglobin: 12
RBC: 4.63

## 2011-06-07 LAB — SAMPLE TO BLOOD BANK

## 2011-06-13 LAB — URINALYSIS, ROUTINE W REFLEX MICROSCOPIC
Glucose, UA: NEGATIVE
Leukocytes, UA: NEGATIVE
Specific Gravity, Urine: <1.005 — ABNORMAL LOW
pH: 6

## 2011-06-13 LAB — URINE MICROSCOPIC-ADD ON

## 2011-06-13 LAB — GC/CHLAMYDIA PROBE AMP, GENITAL: GC Probe Amp, Genital: NEGATIVE

## 2011-06-13 LAB — WET PREP, GENITAL: Yeast Wet Prep HPF POC: NONE SEEN

## 2011-06-14 LAB — URINALYSIS, ROUTINE W REFLEX MICROSCOPIC
Hgb urine dipstick: NEGATIVE
Specific Gravity, Urine: 1.01
Urobilinogen, UA: 0.2

## 2011-06-19 LAB — URINE MICROSCOPIC-ADD ON

## 2011-06-19 LAB — CBC
HCT: 36.9
Hemoglobin: 11.8 — ABNORMAL LOW
RBC: 4.68
RDW: 14.1
WBC: 12.4 — ABNORMAL HIGH

## 2011-06-19 LAB — URINALYSIS, ROUTINE W REFLEX MICROSCOPIC
Specific Gravity, Urine: 1.015
Urobilinogen, UA: 0.2
pH: 6

## 2011-06-21 LAB — WET PREP, GENITAL
Clue Cells Wet Prep HPF POC: NONE SEEN
Yeast Wet Prep HPF POC: NONE SEEN

## 2011-06-21 LAB — CBC
Hemoglobin: 11.4 g/dL — ABNORMAL LOW (ref 12.0–15.0)
Platelets: 189 10*3/uL (ref 150–400)
Platelets: 195 10*3/uL (ref 150–400)
RDW: 13.5 % (ref 11.5–15.5)
WBC: 6.9 10*3/uL (ref 4.0–10.5)
WBC: 7.1 10*3/uL (ref 4.0–10.5)

## 2011-06-21 LAB — DIFFERENTIAL
Basophils Absolute: 0 10*3/uL (ref 0.0–0.1)
Eosinophils Relative: 0 % (ref 0–5)
Lymphocytes Relative: 4 % — ABNORMAL LOW (ref 12–46)
Monocytes Absolute: 0.5 10*3/uL (ref 0.1–1.0)

## 2011-06-21 LAB — URINALYSIS, ROUTINE W REFLEX MICROSCOPIC
Nitrite: NEGATIVE
Specific Gravity, Urine: >1.03 — ABNORMAL HIGH (ref 1.005–1.030)
Urobilinogen, UA: 0.2 mg/dL (ref 0.0–1.0)

## 2011-06-21 LAB — COMPREHENSIVE METABOLIC PANEL
AST: 22 U/L (ref 0–37)
Albumin: 2.7 g/dL — ABNORMAL LOW (ref 3.5–5.2)
Alkaline Phosphatase: 44 U/L (ref 39–117)
Chloride: 100 mEq/L (ref 96–112)
Creatinine, Ser: 0.63 mg/dL (ref 0.4–1.2)
GFR calc Af Amer: >60 mL/min (ref >60)
Potassium: 3.7 mEq/L (ref 3.5–5.1)
Sodium: 134 mEq/L — ABNORMAL LOW (ref 135–145)
Total Bilirubin: 0.6 mg/dL (ref 0.3–1.2)

## 2011-06-21 LAB — POCT PREGNANCY, URINE: Preg Test, Ur: POSITIVE

## 2011-06-21 LAB — AMYLASE: Amylase: 48 U/L (ref 27–131)

## 2011-08-17 DIAGNOSIS — O039 Complete or unspecified spontaneous abortion without complication: Secondary | ICD-10-CM

## 2011-08-17 HISTORY — DX: Complete or unspecified spontaneous abortion without complication: O03.9

## 2011-08-21 ENCOUNTER — Ambulatory Visit (INDEPENDENT_AMBULATORY_CARE_PROVIDER_SITE_OTHER): Payer: BC Managed Care – PPO

## 2011-08-21 DIAGNOSIS — Z3201 Encounter for pregnancy test, result positive: Secondary | ICD-10-CM

## 2011-08-21 DIAGNOSIS — N760 Acute vaginitis: Secondary | ICD-10-CM

## 2011-08-23 ENCOUNTER — Ambulatory Visit: Payer: BC Managed Care – PPO

## 2011-11-06 ENCOUNTER — Ambulatory Visit (INDEPENDENT_AMBULATORY_CARE_PROVIDER_SITE_OTHER): Payer: BC Managed Care – PPO | Admitting: Family Medicine

## 2011-11-06 VITALS — BP 100/64 | HR 80 | Temp 98.3°F | Resp 16 | Ht 65.0 in | Wt 127.0 lb

## 2011-11-06 DIAGNOSIS — K5900 Constipation, unspecified: Secondary | ICD-10-CM

## 2011-11-06 DIAGNOSIS — N898 Other specified noninflammatory disorders of vagina: Secondary | ICD-10-CM

## 2011-11-06 DIAGNOSIS — K921 Melena: Secondary | ICD-10-CM

## 2011-11-06 DIAGNOSIS — Z124 Encounter for screening for malignant neoplasm of cervix: Secondary | ICD-10-CM

## 2011-11-06 DIAGNOSIS — Z719 Counseling, unspecified: Secondary | ICD-10-CM

## 2011-11-06 DIAGNOSIS — K602 Anal fissure, unspecified: Secondary | ICD-10-CM

## 2011-11-06 LAB — POCT WET PREP WITH KOH

## 2011-11-06 MED ORDER — METRONIDAZOLE 500 MG PO TABS: 500.0000 mg | ORAL_TABLET | Freq: Two times a day (BID) | ORAL | Status: AC

## 2011-11-06 MED ORDER — FLUCONAZOLE 150 MG PO TABS: 150.0000 mg | ORAL_TABLET | Freq: Once | ORAL | Status: AC

## 2011-11-06 NOTE — Patient Instructions (Addendum)
Try a stool softener such as docusate sodium to keep your stool soft and avoid tears .  Drinking plenty of water can help too.  If you are considering a pregnancy in the next few months start your prenatal vitamin right away!  However, avoid the kind with iron as these may worsen your constipation.

## 2011-11-06 NOTE — Progress Notes (Signed)
Patient Name: [name] Date of Birth: 05-Nov-1990 Medical Record Number: [id number] Gender: female Date of Encounter: 11/06/2011  History of Present Illness:  [name] is a 21 y.o. very pleasant female patient who presents with the following:  Here in December and diagnosed with BV- however she was in the process of having a miscarriage so she did not use the vaginal medication she was prescribed.  She completed the miscarriage spontaneously- no procedure needed.  About a week ago she noted blood in her stool and on the toilet paper.  This has not occurred with every BM but with some.  "I hate to go" because she has pain for the last week.  Had a lot of pain the first time she had a BM with bleeding.    Had the same problem in 2009 after she had another [facility].  She was told to "eat right" but "they didn't see anything."   No family history of colon cancer.    Afraid that she still may have BV- has some discharge and some odor still.    LMP was 10/09/11.  She does wish to become pregnant again some time soon.    Social history: not married, but has a BF.  No children- has suffered two mc  There is no problem list on file for this patient.  Past Medical History  Diagnosis Date  . Miscarriage 12/12   Past Surgical History  Procedure Date  . Cervical cerclage 2009   History  Substance Use Topics  . Smoking status: Never Smoker   . Smokeless tobacco: Never Used  . Alcohol Use: Yes     social   Family History  Problem Relation Age of Onset  . Hypertension Mother    No Known Allergies  Medication list has been reviewed and updated. I have reviewed the patient's medical history in detail and updated the computerized patient record.   Review of Systems: As per HPI, otherwise negative  Physical Examination: Filed Vitals:   11/06/11 0836  BP: 100/64  Pulse: 80  Temp: 98.3 F (36.8 C)  TempSrc: Oral  Resp: 16  Height: 5\' 5"  (1.651 m)  Weight: 127 lb (57.607 kg)     Body mass index is 21.13 kg/(m^2).  GEN: WDWN, NAD, Non-toxic, A & O x 3 HEENT: Atraumatic, Normocephalic. Neck supple. No masses, No LAD. Ears and Nose: No external deformity.  TM and Oropharynx wnl CV: RRR, No M/G/R. No JVD. No thrill. No extra heart sounds. PULM: CTA B, no wheezes, crackles, rhonchi. No retractions. No resp. distress. No accessory muscle use. ABD: S, NT, ND, +BS. No rebound. No HSM. EXTR: No c/c/e GU:  External genitalia wnl.  Some vaginal discharge but vaginal canal otherwise wnl.  No cervical motion tenderness or adnexal tenderness or masses.   Rectal: visible fissure at about 5:00 position.  No frank blood NEURO Normal gait.  PSYCH: Normally interactive. Conversant. Not depressed or anxious appearing.  Calm demeanor.   Results for orders placed in visit on 11/06/11  POCT WET PREP WITH KOH      Component Value Range   Trichomonas, UA Negative     Clue Cells Wet Prep HPF POC 100%     Epithelial Wet Prep HPF POC 10-15     Yeast Wet Prep HPF POC negative     Bacteria Wet Prep HPF POC 2+     RBC Wet Prep HPF POC 1-4     WBC Wet Prep HPF POC 12-20  KOH Prep POC Positive    IFOBT (OCCULT BLOOD)      Component Value Range   IFOBT Negative      Assessment and Plan: 1. Blood in stool  IFOBT POC (occult bld, rslt in office)  2. Constipation    3. Vaginal Discharge  POCT Wet Prep with KOH, Pap IG, CT/NG w/ reflex HPV when ASC-U, fluconazole (DIFLUCAN) 150 MG tablet  4. Fissure, anal  metroNIDAZOLE (FLAGYL) 500 MG tablet   Gave Rx for cardizen and lidocaine gels to use for her fissure.  counseled re: need to keep stool soft and recommended a medication such as colace, as well as increased fluid intake.  If her tear is not healed and bleeing does not resolve in the next 2 weeks come back in. BV and yeast vag:  Metronidazole 500 BID for 7 days, diflucan.  Patient wondered if her BF needed to be treated as well. Let her know there is no evidence that this is  necessary Did pap as she is due.  Also GC/CMZ screen Preconception counseling: recommended that she give her body a few months to recover from her recent [facility] by using condoms.  Go ahead and start a PNV now if she is considering pregnancy!

## 2011-11-10 ENCOUNTER — Telehealth: Payer: Self-pay

## 2011-11-10 NOTE — Telephone Encounter (Signed)
Pt calling to say she got medication for bv but the medication is making her sick would like to know if we can call something else in for her if possible

## 2011-11-11 MED ORDER — METRONIDAZOLE 0.75 % VA GEL: 1.0000 | VAGINAL | Status: AC

## 2011-11-11 NOTE — Telephone Encounter (Signed)
Called pt and she is taking the med with food and avoiding alcohol. Pt needs RX sent to [facility] on [address]

## 2011-11-11 NOTE — Telephone Encounter (Signed)
Please call patient and verify that she is taking the medicine with food and is avoiding alcohol of all amounts (even in mouthwash!).  If patient is taking the med properly and still having symptoms, ok to change to metrogel vaginal, 1 applicatorful PV QHS x 5 days, no RF

## 2011-11-12 ENCOUNTER — Encounter: Payer: Self-pay | Admitting: Family Medicine

## 2011-11-12 LAB — PAP IG, CT-NG, RFX HPV ASCU
Chlamydia Probe Amp: NEGATIVE
GC Probe Amp: NEGATIVE

## 2011-12-10 LAB — OB RESULTS CONSOLE RUBELLA ANTIBODY, IGM: Rubella: IMMUNE

## 2011-12-10 LAB — OB RESULTS CONSOLE HEPATITIS B SURFACE ANTIGEN: Hepatitis B Surface Ag: NEGATIVE

## 2011-12-10 LAB — OB RESULTS CONSOLE RPR: RPR: NONREACTIVE

## 2011-12-16 ENCOUNTER — Ambulatory Visit (INDEPENDENT_AMBULATORY_CARE_PROVIDER_SITE_OTHER): Payer: BC Managed Care – PPO | Admitting: Family Medicine

## 2011-12-16 VITALS — BP 94/58 | HR 90 | Temp 98.2°F | Resp 16 | Ht 64.0 in | Wt 127.0 lb

## 2011-12-16 DIAGNOSIS — N898 Other specified noninflammatory disorders of vagina: Secondary | ICD-10-CM

## 2011-12-16 DIAGNOSIS — N760 Acute vaginitis: Secondary | ICD-10-CM

## 2011-12-16 LAB — POCT UA - MICROSCOPIC ONLY
Bacteria, U Microscopic: NEGATIVE
Casts, Ur, LPF, POC: NEGATIVE
Crystals, Ur, HPF, POC: NEGATIVE
Mucus, UA: NEGATIVE
RBC, urine, microscopic: NEGATIVE
Yeast, UA: NEGATIVE

## 2011-12-16 LAB — POCT WET PREP WITH KOH
KOH Prep POC: NEGATIVE
Trichomonas, UA: NEGATIVE
Yeast Wet Prep HPF POC: NEGATIVE

## 2011-12-16 LAB — POCT URINALYSIS DIPSTICK
Bilirubin, UA: NEGATIVE
Blood, UA: NEGATIVE
Glucose, UA: NEGATIVE
Ketones, UA: NEGATIVE
Leukocytes, UA: NEGATIVE
Nitrite, UA: NEGATIVE
Spec Grav, UA: 1.015
Urobilinogen, UA: 0.2
pH, UA: 8

## 2011-12-16 MED ORDER — FLUCONAZOLE 150 MG PO TABS: 150.0000 mg | ORAL_TABLET | Freq: Once | ORAL | Status: AC

## 2011-12-16 NOTE — Progress Notes (Signed)
21 yo pregnant woman with vaginal irritation for two days.  LMP:  January 23rd.  She has OB appt. Was checked 1 week ago by [facility] for 2nd visit.  Monogamous, checked for STD last week. [employer] student in prelaw. Not worried about STD's. Some frequency, no dysuria. O: No acute distress, friendly and patient  External vaginal  speculum exam: Yellow-green watery discharge Results for orders placed in visit on 12/16/11  POCT UA - MICROSCOPIC ONLY      Component Value Range   WBC, Ur, HPF, POC 0-1     RBC, urine, microscopic neg     Bacteria, U Microscopic neg     Mucus, UA neg     Epithelial cells, urine per micros 0-3     Crystals, Ur, HPF, POC neg     Casts, Ur, LPF, POC neg     Yeast, UA neg    POCT URINALYSIS DIPSTICK      Component Value Range   Color, UA yellow     Clarity, UA cloudy     Glucose, UA neg     Bilirubin, UA neg     Ketones, UA neg     Spec Grav, UA 1.015     Blood, UA neg     pH, UA 8.0     Protein, UA trace     Urobilinogen, UA 0.2     Nitrite, UA neg     Leukocytes, UA Negative    POCT WET PREP WITH KOH      Component Value Range   Trichomonas, UA Negative     Clue Cells Wet Prep HPF POC 2-3     Epithelial Wet Prep HPF POC 3-5     Yeast Wet Prep HPF POC neg     Bacteria Wet Prep HPF POC 1+     RBC Wet Prep HPF POC 0-1     WBC Wet Prep HPF POC 2-5     KOH Prep POC Negative     A:  Mild introitus vaginitis.  P:  Hydrocort vag cream external and Diflucan

## 2011-12-25 ENCOUNTER — Encounter (HOSPITAL_COMMUNITY): Payer: Self-pay | Admitting: *Deleted

## 2011-12-27 ENCOUNTER — Inpatient Hospital Stay (HOSPITAL_COMMUNITY)
Admission: AD | Admit: 2011-12-27 | Discharge: 2011-12-28 | Disposition: A | Discharge status: Home / Self Care | Payer: BC Managed Care – PPO | Source: Ambulatory Visit | Attending: Obstetrics and Gynecology | Admitting: Obstetrics and Gynecology

## 2011-12-27 ENCOUNTER — Encounter (HOSPITAL_COMMUNITY): Payer: Self-pay | Admitting: *Deleted

## 2011-12-27 DIAGNOSIS — O209 Hemorrhage in early pregnancy, unspecified: Secondary | ICD-10-CM

## 2011-12-27 HISTORY — DX: Other specified health status: Z78.9

## 2011-12-27 NOTE — Progress Notes (Signed)
Easton Rice Pa in to see pt

## 2011-12-27 NOTE — MAU Note (Signed)
Was having a BM and when I wiped I saw blood, I wiped again to be sure and blood seems to be coming from vagina

## 2011-12-28 ENCOUNTER — Inpatient Hospital Stay (HOSPITAL_COMMUNITY): Payer: BC Managed Care – PPO

## 2011-12-28 LAB — CBC
MCHC: 33.2 g/dL (ref 30.0–36.0)
MCV: 74.7 fL — ABNORMAL LOW (ref 78.0–100.0)
Platelets: 175 10*3/uL (ref 150–400)
RDW: 13.9 % (ref 11.5–15.5)
WBC: 7.3 10*3/uL (ref 4.0–10.5)

## 2011-12-28 LAB — WET PREP, GENITAL
Clue Cells Wet Prep HPF POC: NONE SEEN
Trich, Wet Prep: NONE SEEN

## 2011-12-28 LAB — GC/CHLAMYDIA PROBE AMP, GENITAL: Chlamydia, DNA Probe: NEGATIVE

## 2011-12-28 MED ORDER — ONDANSETRON 4 MG PO TBDP: 4.0000 mg | ORAL_TABLET | Freq: Once | ORAL | Status: DC

## 2011-12-28 NOTE — MAU Provider Note (Signed)
  History   Pt presents today c/o vag bleeding. She states when she had a BM earlier today she noticed blood in the tissue when she wiped. She states she thinks the bleeding is coming from her vagina. She denies abd pain, vag irritation, or any other sx. She denies recent intercourse.  CSN: [id number]  Arrival date and time: 12/27/11 2321   First Provider Initiated Contact with Patient 12/27/11 2349      Chief Complaint  Patient presents with  . Vaginal Bleeding   HPI  OB History    Grav Para Term Preterm Abortions TAB SAB Ect Mult Living   5 2 0 2 2  2    0      Past Medical History  Diagnosis Date  . Miscarriage 12/12  . No pertinent past medical history     Past Surgical History  Procedure Date  . Cervical cerclage 2009  . Cervical cerclage     Family History  Problem Relation Age of Onset  . Hypertension Mother   . Anesthesia problems Neg Hx   . Hypotension Neg Hx   . Malignant hyperthermia Neg Hx   . Pseudochol deficiency Neg Hx     History  Substance Use Topics  . Smoking status: Never Smoker   . Smokeless tobacco: Never Used  . Alcohol Use: No     social    Allergies: No Known Allergies  Prescriptions prior to admission  Medication Sig Dispense Refill  . Prenatal Vit-Fe Fumarate-FA (PRENATAL MULTIVITAMIN) TABS Take 1 tablet by mouth daily.        Review of Systems  Constitutional: Negative for fever and chills.  Eyes: Negative for blurred vision and double vision.  Cardiovascular: Negative for chest pain and palpitations.  Gastrointestinal: Negative for nausea, vomiting, abdominal pain, diarrhea and constipation.  Genitourinary: Negative for dysuria, urgency, frequency and hematuria.  Neurological: Negative for dizziness and headaches.  Psychiatric/Behavioral: Negative for depression and suicidal ideas.   Physical Exam   Blood pressure 114/61, pulse 76, temperature 97.1 F (36.2 C), resp. rate 20, height 5\' 3"  (1.6 m), weight 131 lb (59.421  kg), last menstrual period 10/09/2011.  Physical Exam  Nursing note and vitals reviewed. Constitutional: She is oriented to person, place, and time. She appears well-developed and well-nourished. No distress.  HENT:  Head: Normocephalic and atraumatic.  Eyes: EOM are normal. Pupils are equal, round, and reactive to light.  GI: Soft. She exhibits no distension and no mass. There is no tenderness. There is no rebound and no guarding.  Genitourinary: There is bleeding around the vagina. Vaginal discharge found.       Thin, bloody dc present in the vag vault. Cervix Lg/closed. Pt non-tender on exam.  Neurological: She is alert and oriented to person, place, and time.  Skin: Skin is warm and dry. She is not diaphoretic.  Psychiatric: She has a normal mood and affect. Her behavior is normal. Judgment and thought content normal.    MAU Course  Procedures  Wet prep and GC/Chlamydia cultures done.  US shows single IUP with good cardiac activity and consistent with her EDC. No evidence of active bleeding.  Assessment and Plan  Bleeding in preg: pt doing well at this time. She will be on pelvic rest and f/u with Dr. Vance Gather office on Monday. Discussed diet, activity, risks, and precautions.  Clinton Gallant. Azarel Banner III, DrHSc, MPAS, PA-C  12/28/2011, 12:00 AM

## 2011-12-28 NOTE — Discharge Instructions (Signed)
Vaginal Bleeding During Pregnancy  A small amount of bleeding from the vagina can happen anytime during pregnancy. Be sure to tell your doctor about all vaginal bleeding.   HOME CARE   Get plenty of rest and sleep.   Count the number of pads you use each day. Do not use tampons.   Save any tissue you pass for your doctor to see.   Do not exercise   Do not do any heavy lifting.   Avoid going up and down stairs. If you must climb stairs, go slowly.   Do not have sex (intercourse) or orgasms until approved by your doctor.   Do not douche.   Only take medicine as told by your doctor. Do not take aspirin.   Eat healthy.   Always keep your follow-up appointments.  GET HELP RIGHT AWAY IF:    You feel the baby moving less or not moving at all.   The bleeding gets worse.   You have very painful cramps or pain in your stomach or back.   You pass large clots or anything that looks like tissue.   You have a temperature by mouth above 102 F (38.9 C).   You feel very weak.   You have chills.   You feel dizzy or pass out (faint).   You have a gush of fluid from the vagina.  MAKE SURE YOU:    Understand these instructions.   Will watch your condition.   Will get help right away if you are not doing well or get worse.  Document Released: 06/11/2008 Document Revised: 08/22/2011 Document Reviewed: 08/08/2009  [employer] Patient Information 2012 [employer].

## 2011-12-28 NOTE — Progress Notes (Signed)
Written and verbal d/c instructions given and understanding voiced. Henrietta Hoover PA in discussing u/s results.

## 2012-01-01 ENCOUNTER — Encounter (HOSPITAL_COMMUNITY): Payer: Self-pay | Admitting: Pharmacist

## 2012-01-14 NOTE — H&P (Signed)
ARNETTE DRIGGS  DICTATION # [id number] CSN# [id number]   Meriel Pica, MD 01/14/2012 1:51 PM

## 2012-01-15 NOTE — H&P (Signed)
[name], [name]                 ACCOUNT NO.:  [id number]  MEDICAL RECORD NO.:  [id number]  LOCATION:  YQ65                          FACILITY:  [facility]  PHYSICIAN:  Duke Salvia. Marcelle Overlie, M.D.DATE OF BIRTH:  05/04/1991  DATE OF ADMISSION:  12/27/2011 DATE OF DISCHARGE:  12/28/2011                             HISTORY & PHYSICAL   CHIEF COMPLAINT:  Pregnancy, history of incompetent cervix.  HISTORY OF PRESENT ILLNESS:  A 21 year old G5, P0-0-4-0, EDD November 13 presents for transabdominal cerclage based on history of incompetent cervix and failed cervical cerclage in the past.  Her first pregnancy loss at 16 weeks in 2008, 20 weeks in 2009 with a cervical cerclage, 24 weeks in 2009 with a cervical cerclage.  Early pregnancy loss 2012 but 7- 8 weeks.  We reviewed the procedure of abdominal cerclage including specific risks related to bleeding, infection, transfusion, adjacent organ injury, wound infection, phlebitis, pregnancy loss, which she understands and accepts.  PAST MEDICAL HISTORY:  Allergies:  NICKEL.  No drug allergies.  FAMILY HISTORY:  Significant for hypertension in her mother and her paternal aunt with MS, otherwise negative.  Denies alcohol or tobacco use.  PHYSICAL EXAMINATION:  VIAL SIGNS:  Temp 98.2, blood pressure 90/60. HEENT:  Unremarkable. NECK:  Supple out masses. LUNGS:  Clear. CARDIOVASCULAR:  Regular rate and rhythm without murmurs, rubs, gallops. BREASTS:  Without masses. ABDOMEN:  Soft, flat, nontender.  Fetal heart rate was 140.  Cervix was closed.  EXTREMITIES AND NEUROLOGIC:  Unremarkable.  IMPRESSION: 1. A 14-week intrauterine pregnancy. 2. History of failed cervical cerclage x2. 3. Incompetent cervix.  PLAN:  Abdominal cerclage.  Procedure and risks discussed as above.    Atavia Poppe M. Marcelle Overlie, M.D.    RMH/MEDQ  D:  01/14/2012  T:  01/14/2012  Job:  [id number]

## 2012-01-22 MED ORDER — DEXTROSE IN LACTATED RINGERS 5 % IV SOLN: INTRAVENOUS | Status: DC

## 2012-01-23 ENCOUNTER — Ambulatory Visit (HOSPITAL_COMMUNITY)
Admission: RE | Admit: 2012-01-23 | Discharge: 2012-01-24 | Disposition: A | Discharge status: Home / Self Care | Payer: BC Managed Care – PPO | Source: Ambulatory Visit | Attending: Obstetrics and Gynecology | Admitting: Obstetrics and Gynecology

## 2012-01-23 ENCOUNTER — Encounter (HOSPITAL_COMMUNITY): Payer: Self-pay | Admitting: Anesthesiology

## 2012-01-23 ENCOUNTER — Inpatient Hospital Stay (HOSPITAL_COMMUNITY): Payer: BC Managed Care – PPO | Admitting: Anesthesiology

## 2012-01-23 ENCOUNTER — Encounter (HOSPITAL_COMMUNITY)
Admission: RE | Disposition: A | Discharge status: Home / Self Care | Payer: Self-pay | Source: Ambulatory Visit | Attending: Obstetrics and Gynecology

## 2012-01-23 DIAGNOSIS — O3430 Maternal care for cervical incompetence, unspecified trimester: Principal | ICD-10-CM | POA: Insufficient documentation

## 2012-01-23 HISTORY — PX: ABDOMINAL CERCLAGE: SHX5384

## 2012-01-23 LAB — CBC
MCH: 25 pg — ABNORMAL LOW (ref 26.0–34.0)
MCHC: 34.3 g/dL (ref 30.0–36.0)
MCV: 72.8 fL — ABNORMAL LOW (ref 78.0–100.0)
Platelets: 198 10*3/uL (ref 150–400)
RDW: 13.9 % (ref 11.5–15.5)

## 2012-01-23 SURGERY — CERCLAGE, CERVIX, ABDOMINAL APPROACH
Anesthesia: Spinal | Site: Abdomen | Wound class: Clean Contaminated

## 2012-01-23 MED ORDER — CEFAZOLIN SODIUM 1-5 GM-% IV SOLN
1.0000 g | INTRAVENOUS | Status: AC
  Administered 2012-01-23: 1 g via INTRAVENOUS

## 2012-01-23 MED ORDER — ONDANSETRON HCL 4 MG/2ML IJ SOLN
INTRAMUSCULAR | Status: DC | PRN
  Administered 2012-01-23: 4 mg via INTRAVENOUS

## 2012-01-23 MED ORDER — FENTANYL CITRATE 0.05 MG/ML IJ SOLN
INTRAMUSCULAR | Status: AC
  Filled 2012-01-23: qty 2

## 2012-01-23 MED ORDER — IBUPROFEN 800 MG PO TABS
800.0000 mg | ORAL_TABLET | Freq: Three times a day (TID) | ORAL | Status: DC | PRN
  Administered 2012-01-24: 800 mg via ORAL
  Filled 2012-01-23: qty 1

## 2012-01-23 MED ORDER — BUTORPHANOL TARTRATE 2 MG/ML IJ SOLN
1.0000 mg | INTRAMUSCULAR | Status: DC | PRN
  Administered 2012-01-23 – 2012-01-24 (×4): 2 mg via INTRAVENOUS
  Filled 2012-01-23 (×4): qty 1

## 2012-01-23 MED ORDER — DEXTROSE IN LACTATED RINGERS 5 % IV SOLN
INTRAVENOUS | Status: DC
  Administered 2012-01-23 (×2): via INTRAVENOUS
  Administered 2012-01-23: 125 mL/h via INTRAVENOUS
  Administered 2012-01-24: 06:00:00 via INTRAVENOUS

## 2012-01-23 MED ORDER — CEFAZOLIN SODIUM 1-5 GM-% IV SOLN
INTRAVENOUS | Status: AC
  Filled 2012-01-23: qty 50

## 2012-01-23 MED ORDER — ONDANSETRON HCL 4 MG/2ML IJ SOLN
4.0000 mg | Freq: Four times a day (QID) | INTRAMUSCULAR | Status: DC | PRN
  Administered 2012-01-23: 4 mg via INTRAVENOUS
  Filled 2012-01-23: qty 2

## 2012-01-23 MED ORDER — HYDROXYPROGESTERONE CAPROATE 250 MG/ML IM OIL
250.0000 mg | TOPICAL_OIL | Freq: Once | INTRAMUSCULAR | Status: AC
  Administered 2012-01-23: 250 mg via INTRAMUSCULAR
  Filled 2012-01-23: qty 1

## 2012-01-23 MED ORDER — ONDANSETRON HCL 4 MG/2ML IJ SOLN
INTRAMUSCULAR | Status: AC
  Filled 2012-01-23: qty 2

## 2012-01-23 MED ORDER — LACTATED RINGERS IV SOLN
INTRAVENOUS | Status: DC
  Administered 2012-01-23: 08:00:00 via INTRAVENOUS
  Administered 2012-01-23: 1000 mL via INTRAVENOUS

## 2012-01-23 MED ORDER — BUPIVACAINE IN DEXTROSE 0.75-8.25 % IT SOLN
INTRATHECAL | Status: DC | PRN
  Administered 2012-01-23: 10.5 mg via INTRATHECAL

## 2012-01-23 MED ORDER — FENTANYL CITRATE 0.05 MG/ML IJ SOLN
25.0000 ug | INTRAMUSCULAR | Status: DC | PRN
  Administered 2012-01-23: 50 ug via INTRAVENOUS
  Administered 2012-01-23 (×2): 25 ug via INTRAVENOUS

## 2012-01-23 MED ORDER — MENTHOL 3 MG MT LOZG
1.0000 | LOZENGE | OROMUCOSAL | Status: DC | PRN
  Filled 2012-01-23: qty 9

## 2012-01-23 MED ORDER — ONDANSETRON HCL 4 MG/2ML IJ SOLN: 4.0000 mg | Freq: Once | INTRAMUSCULAR | Status: DC | PRN

## 2012-01-23 MED ORDER — FENTANYL CITRATE 0.05 MG/ML IJ SOLN
INTRAMUSCULAR | Status: DC | PRN
  Administered 2012-01-23 (×2): 50 ug via INTRAVENOUS

## 2012-01-23 MED ORDER — FENTANYL CITRATE 0.05 MG/ML IJ SOLN
INTRAMUSCULAR | Status: AC
  Administered 2012-01-23: 25 ug via INTRAVENOUS
  Filled 2012-01-23: qty 2

## 2012-01-23 MED ORDER — OXYCODONE-ACETAMINOPHEN 5-325 MG PO TABS
1.0000 | ORAL_TABLET | ORAL | Status: DC | PRN
  Administered 2012-01-23 – 2012-01-24 (×2): 2 via ORAL
  Filled 2012-01-23 (×2): qty 2
  Filled 2012-01-23: qty 1

## 2012-01-23 MED ORDER — ONDANSETRON HCL 4 MG PO TABS
4.0000 mg | ORAL_TABLET | Freq: Four times a day (QID) | ORAL | Status: DC | PRN
  Administered 2012-01-24: 4 mg via ORAL
  Filled 2012-01-23: qty 1

## 2012-01-23 MED ORDER — ZOLPIDEM TARTRATE 5 MG PO TABS: 5.0000 mg | ORAL_TABLET | Freq: Every evening | ORAL | Status: DC | PRN

## 2012-01-23 MED ORDER — MEPERIDINE HCL 25 MG/ML IJ SOLN: 6.2500 mg | INTRAMUSCULAR | Status: DC | PRN

## 2012-01-23 SURGICAL SUPPLY — 22 items
APL SKNCLS STERI-STRIP NONHPOA (GAUZE/BANDAGES/DRESSINGS) ×1
BENZOIN TINCTURE PRP APPL 2/3 (GAUZE/BANDAGES/DRESSINGS) ×2 IMPLANT
CLOTH BEACON ORANGE TIMEOUT ST (SAFETY) ×2 IMPLANT
GLOVE BIO SURGEON STRL SZ7 (GLOVE) ×4 IMPLANT
GLOVE BIO SURGEON STRL SZ7.5 (GLOVE) ×2 IMPLANT
GOWN SURGICAL LARGE (GOWNS) ×6 IMPLANT
NEEDLE HYPO 25X1 1.5 SAFETY (NEEDLE) ×2 IMPLANT
NS IRRIG 1000ML POUR BTL (IV SOLUTION) ×2 IMPLANT
PACK C SECTION WH (CUSTOM PROCEDURE TRAY) ×2 IMPLANT
SPONGE GAUZE 4X4 12PLY (GAUZE/BANDAGES/DRESSINGS) ×1 IMPLANT
SPONGE LAP 18X18 X RAY DECT (DISPOSABLE) ×2 IMPLANT
STAPLER VISISTAT 35W (STAPLE) ×2 IMPLANT
STRIP CLOSURE SKIN 1/2X4 (GAUZE/BANDAGES/DRESSINGS) ×2 IMPLANT
SUT MERSILENE 5MM BP 1 12 (SUTURE) ×1 IMPLANT
SUT MON AB 4-0 PS1 27 (SUTURE) ×1 IMPLANT
SUT PDS AB 0 CT1 27 (SUTURE) ×4 IMPLANT
SUT VIC AB 3-0 CT1 27 (SUTURE) ×4
SUT VIC AB 3-0 CT1 TAPERPNT 27 (SUTURE) ×2 IMPLANT
SYR CONTROL 10ML LL (SYRINGE) ×2 IMPLANT
TOWEL OR 17X24 6PK STRL BLUE (TOWEL DISPOSABLE) ×4 IMPLANT
TRAY FOLEY CATH 14FR (SET/KITS/TRAYS/PACK) ×2 IMPLANT
WATER STERILE IRR 1000ML POUR (IV SOLUTION) ×2 IMPLANT

## 2012-01-23 NOTE — Transfer of Care (Signed)
Immediate Anesthesia Transfer of Care Note  Patient: [name]  Procedure(s) Performed: Procedure(s) (LRB): CERCLAGE ABDOMINAL (N/A)  Patient Location: PACU  Anesthesia Type: Spinal  Level of Consciousness: awake, alert  and oriented  Airway & Oxygen Therapy: Patient Spontanous Breathing  Post-op Assessment: Report given to PACU RN and Post -op Vital signs reviewed and stable  Post vital signs: Reviewed and stable  Complications: No apparent anesthesia complications

## 2012-01-23 NOTE — Op Note (Signed)
Preoperative diagnosis: 14+ week IUP, history of incompetent cervix, failed McDonald cerclage x2.  Postoperative diagnosis: Same  Procedure: Transabdominal cerclage  Surgeon: Marcelle Overlie  Assistant: McComb  Anesthesia: Epidural  EBL: 50 cc  Complications: None  Drains: Foley catheter  Procedure and findings:  The patient was taken to the operating room after an adequate level of epidural anesthesia was obtained the abdomen prepped and draped in the usual fashion for abdominal procedures. Foley catheter positioned draining clear urine. Penicillus is made 2 finger breaths above the symphysis carried down to the fascia which was incised and extended transversely. Rectus muscle divided in the midline peritoneum entered superiorly without incident and extended in a vertical fashion. The uterus noted be approximately 14 weeks size was elevated through the incision without difficulty. Bilateral tubes and ovaries were unremarkable. Bladder flap was created by incising the anterior peritoneum and dissecting the bladder was some minimal sharp and blunt dissection below. A Mersilene tape on a curved needle was then used to perform the cerclage. Was started on the patient's right with the uterine vessels pulled laterally right at the junction of the internal os at the margin of the muscle and broad ligament. This was passed posteriorly brought around similarly to anterior and tied down. This was hemostatic the bladder flap peritoneum was then closed with a running 2-0 Vicryl suture. The uterus is and return to its intra-abdominal position. Prior to closure sponge denies precast reported as correct x2 peritoneum closed with a running 2-0 Vicryl suture. 2-0 Vicryl interrupted sutures were then used to reapproximate the rectus muscles in the midline. 0 PDS used to close the fascia from laterally to midline on either side the subcutaneous tissue was hemostatic and fairly minimal. A 4-0 Monocryl subcuticular closure  with Steri-Strips applied. Patient tolerated this well went to recovery room in good condition.  Dictated with [facility]  Kameryn Tisdel M. Milana Obey.D.

## 2012-01-23 NOTE — Anesthesia Preprocedure Evaluation (Addendum)
Anesthesia Evaluation  Patient identified by MRN, date of birth, ID band Patient awake    Reviewed: Allergy & Precautions, H&P , NPO status , Patient's Chart, lab work & pertinent test results  Airway Mallampati: I TM Distance: >3 FB Neck ROM: full    Dental No notable dental hx.    Pulmonary neg pulmonary ROS,  breath sounds clear to auscultation  Pulmonary exam normal       Cardiovascular negative cardio ROS      Neuro/Psych negative neurological ROS  negative psych ROS   GI/Hepatic negative GI ROS, Neg liver ROS,   Endo/Other  negative endocrine ROS  Renal/GU negative Renal ROS  negative genitourinary   Musculoskeletal negative musculoskeletal ROS (+)   Abdominal Normal abdominal exam  (+)   Peds  Hematology negative hematology ROS (+)   Anesthesia Other Findings   Reproductive/Obstetrics (+) Pregnancy (14 weeks, h/o cervical incompetence)                          Anesthesia Physical Anesthesia Plan  ASA: II  Anesthesia Plan: Spinal   Post-op Pain Management:    Induction:   Airway Management Planned:   Additional Equipment:   Intra-op Plan:   Post-operative Plan:   Informed Consent: I have reviewed the patients History and Physical, chart, labs and discussed the procedure including the risks, benefits and alternatives for the proposed anesthesia with the patient or authorized representative who has indicated his/her understanding and acceptance.     Plan Discussed with: CRNA and Surgeon  Anesthesia Plan Comments:         Anesthesia Quick Evaluation

## 2012-01-23 NOTE — Progress Notes (Signed)
FHR 154 by doppler.

## 2012-01-23 NOTE — Anesthesia Procedure Notes (Signed)
Spinal  Patient location during procedure: OR Start time: 01/23/2012 7:27 AM Staffing Performed by: anesthesiologist  Preanesthetic Checklist Completed: patient identified, site marked, surgical consent, pre-op evaluation, timeout performed, IV checked, risks and benefits discussed and monitors and equipment checked Spinal Block Patient position: sitting Prep: site prepped and draped and DuraPrep Patient monitoring: heart rate, cardiac monitor, continuous pulse ox and blood pressure Approach: midline Location: L3-4 Injection technique: single-shot Needle Needle type: Sprotte  Needle gauge: 24 G Needle length: 9 cm Assessment Sensory level: T4 Additional Notes Clear free flow CSF on first attempt.  No paresthesia.  Patient tolerated procedure well.  Jasmine December, MD

## 2012-01-23 NOTE — Progress Notes (Signed)
The patient was re-examined with no change in status 

## 2012-01-24 ENCOUNTER — Encounter (HOSPITAL_COMMUNITY): Payer: Self-pay | Admitting: Obstetrics and Gynecology

## 2012-01-24 LAB — CBC
HCT: 32 % — ABNORMAL LOW (ref 36.0–46.0)
Hemoglobin: 10.4 g/dL — ABNORMAL LOW (ref 12.0–15.0)
MCV: 75.7 fL — ABNORMAL LOW (ref 78.0–100.0)
RDW: 14.2 % (ref 11.5–15.5)
WBC: 12.1 10*3/uL — ABNORMAL HIGH (ref 4.0–10.5)

## 2012-01-24 MED ORDER — ONDANSETRON HCL 4 MG PO TABS: 8.0000 mg | ORAL_TABLET | Freq: Three times a day (TID) | ORAL | Status: AC | PRN

## 2012-01-24 MED ORDER — OXYCODONE-ACETAMINOPHEN 5-325 MG PO TABS: 1.0000 | ORAL_TABLET | Freq: Four times a day (QID) | ORAL | Status: AC | PRN

## 2012-01-24 NOTE — Discharge Summary (Signed)
Physician Discharge Summary  Patient ID: [name] MRN: [id number] DOB/AGE: 19-Jan-1991 21 y.o.  Admit date: 01/23/2012 Discharge date: 01/24/2012  Admission Diagnoses:  Discharge Diagnoses:  Active Problems:  * No active hospital problems. *    Discharged Condition: good  Hospital Course: abd cerclage under epidural, cath out that PM, diet advanced and Inc CD POD #1, tol PO, afeb ready for D/C  Consults: None  Significant Diagnostic Studies: labs:  Results for orders placed during the hospital encounter of 01/23/12 (from the past 48 hour(s))  CBC     Status: Abnormal   Collection Time   01/23/12  6:19 AM      Component Value Range Comment   WBC 7.8  4.0 - 10.5 (K/uL)    RBC 4.56  3.87 - 5.11 (MIL/uL)    Hemoglobin 11.4 (*) 12.0 - 15.0 (g/dL)    HCT 14.7 (*) 82.9 - 46.0 (%)    MCV 72.8 (*) 78.0 - 100.0 (fL)    MCH 25.0 (*) 26.0 - 34.0 (pg)    MCHC 34.3  30.0 - 36.0 (g/dL)    RDW 56.2  13.0 - 86.5 (%)    Platelets 198  150 - 400 (K/uL)   CBC     Status: Abnormal   Collection Time   01/24/12  5:20 AM      Component Value Range Comment   WBC 12.1 (*) 4.0 - 10.5 (K/uL)    RBC 4.23  3.87 - 5.11 (MIL/uL)    Hemoglobin 10.4 (*) 12.0 - 15.0 (g/dL)    HCT 78.4 (*) 69.6 - 46.0 (%)    MCV 75.7 (*) 78.0 - 100.0 (fL)    MCH 24.6 (*) 26.0 - 34.0 (pg)    MCHC 32.5  30.0 - 36.0 (g/dL)    RDW 29.5  28.4 - 13.2 (%)    Platelets 192  150 - 400 (K/uL)     Treatments: surgery: abd cerclage  Discharge Exam: Blood pressure 109/67, pulse 63, temperature 98.4 F (36.9 C), temperature source Oral, resp. rate 16, height 5\' 3"  (1.6 m), weight 131 lb (59.421 kg), last menstrual period 10/09/2011, SpO2 98.00%. General appearance: alert Incision/Wound:CD  Disposition: 01-Home or Self Care   Medication List  As of 01/24/2012  9:07 AM   TAKE these medications         ondansetron 4 MG tablet   Commonly known as: ZOFRAN   Take 2 tablets (8 mg total) by mouth every 8 (eight) hours as needed  for nausea.      oxyCODONE-acetaminophen 5-325 MG per tablet   Commonly known as: PERCOCET   Take 1-2 tablets by mouth every 6 (six) hours as needed (moderate to severe pain (when tolerating fluids)).      prenatal multivitamin Tabs   Take 1 tablet by mouth daily.           Follow-up Information    Follow up with Meriel Pica, MD. (has appt for next week)    Contact information:   [address] [place] [address] [phone]          Signed: Meriel Pica 01/24/2012, 9:07 AM

## 2012-01-24 NOTE — Progress Notes (Signed)
SW met with patient briefly in her third floor room to check in since initially meeting with patient three years ago when she had a loss in NICU.  Patient appeared extremely appreciative of SW's visit and is hopeful about this pregnancy.  She reports that things are going well for her.  She states no questions, concerns or needs at this time.

## 2012-01-24 NOTE — Progress Notes (Signed)
1 Day Post-Op Procedure(s) (LRB): CERCLAGE ABDOMINAL (N/A)  Subjective: Patient reports nausea.    Objective: I have reviewed patient's vital signs and labs.  abd , soft + BS>>>Inc C/D CBC    Component Value Date/Time   WBC 12.1* 01/24/2012 0520   RBC 4.23 01/24/2012 0520   HGB 10.4* 01/24/2012 0520   HCT 32.0* 01/24/2012 0520   PLT 192 01/24/2012 0520   MCV 75.7* 01/24/2012 0520   MCH 24.6* 01/24/2012 0520   MCHC 32.5 01/24/2012 0520   RDW 14.2 01/24/2012 0520   LYMPHSABS 1.9 10/31/2008 0045   MONOABS 0.7 10/31/2008 0045   EOSABS 0.1 10/31/2008 0045   BASOSABS 0.1 10/31/2008 0045     Assessment: s/p Procedure(s) (LRB): CERCLAGE ABDOMINAL (N/A): stable  Plan: poss DC this afternoon  LOS: 1 day    Celestino Ackerman M 01/24/2012, 9:01 AM

## 2012-01-25 NOTE — Anesthesia Postprocedure Evaluation (Signed)
  Anesthesia Post-op Note  Patient: [name]  Procedure(s) Performed: Procedure(s) (LRB): CERCLAGE ABDOMINAL (N/A)  Patient Location: PACU and Women's Unit  Anesthesia Type: Spinal  Level of Consciousness: awake, alert , oriented and patient cooperative  Airway and Oxygen Therapy: Patient Spontanous Breathing  Post-op Pain: none  Post-op Assessment: Post-op Vital signs reviewed  Post-op Vital Signs: Reviewed and stable  Complications: No apparent anesthesia complications

## 2012-01-26 NOTE — Anesthesia Postprocedure Evaluation (Signed)
Anesthesia Post Note  Patient: [name]  Procedure(s) Performed: Procedure(s) (LRB): CERCLAGE ABDOMINAL (N/A)  Anesthesia type: Spinal  Patient location: PACU  Post pain: Pain level controlled  Post assessment: Post-op Vital signs reviewed  Post vital signs: Reviewed  Level of consciousness: awake  Complications: No apparent anesthesia complications

## 2012-02-17 ENCOUNTER — Inpatient Hospital Stay (HOSPITAL_COMMUNITY)
Admission: AD | Admit: 2012-02-17 | Discharge: 2012-02-17 | Disposition: A | Discharge status: Home / Self Care | Payer: BC Managed Care – PPO | Source: Ambulatory Visit | Attending: Obstetrics and Gynecology | Admitting: Obstetrics and Gynecology

## 2012-02-17 ENCOUNTER — Encounter (HOSPITAL_COMMUNITY): Payer: Self-pay | Admitting: *Deleted

## 2012-02-17 DIAGNOSIS — O3430 Maternal care for cervical incompetence, unspecified trimester: Secondary | ICD-10-CM | POA: Insufficient documentation

## 2012-02-17 DIAGNOSIS — R358 Other polyuria: Secondary | ICD-10-CM

## 2012-02-17 DIAGNOSIS — N898 Other specified noninflammatory disorders of vagina: Secondary | ICD-10-CM | POA: Insufficient documentation

## 2012-02-17 DIAGNOSIS — O26899 Other specified pregnancy related conditions, unspecified trimester: Secondary | ICD-10-CM

## 2012-02-17 DIAGNOSIS — O99891 Other specified diseases and conditions complicating pregnancy: Secondary | ICD-10-CM | POA: Insufficient documentation

## 2012-02-17 LAB — WET PREP, GENITAL
Clue Cells Wet Prep HPF POC: NONE SEEN
Trich, Wet Prep: NONE SEEN
Yeast Wet Prep HPF POC: NONE SEEN

## 2012-02-17 LAB — URINALYSIS, ROUTINE W REFLEX MICROSCOPIC
Bilirubin Urine: NEGATIVE
Glucose, UA: NEGATIVE mg/dL
Hgb urine dipstick: NEGATIVE
Specific Gravity, Urine: 1.01 (ref 1.005–1.030)
pH: 7.5 (ref 5.0–8.0)

## 2012-02-17 LAB — URINE MICROSCOPIC-ADD ON

## 2012-02-17 NOTE — MAU Note (Signed)
C/o leaking fluid; leaking is not constant and mostly when she has to void; denies any pain; hx of cerclage x2 and preterm deliveries  X2; G5P0;

## 2012-02-17 NOTE — MAU Note (Signed)
Patient states she started leaking clear fluid during the night. Not wearing a pad. No pain.

## 2012-02-17 NOTE — MAU Provider Note (Signed)
[name] [id number] @[redacted]w[redacted]d  by LMP Chief Complaint  Patient presents with  . Vaginal Discharge     First Provider Initiated Contact with Patient 02/17/12 1032      SUBJECTIVE  HPI: HPI: [name] is a 21 y.o. year old [id number] female at [redacted]w[redacted]d weeks gestation who presents to MAU reporting leaking of fluid vs increased urination since last night. She states she had to get up more that usual during the night to urinate, but denies leaking on the way to the bathroom. She did not wear underwear or pad to MAU and denies leaking since arrival. On further questioning, she reports increased pedal edema over the past few days that improves over night. She also denies VB, Abd pain, pelvic pressure, dysuria, hematuria, urgency, fever, chills or vaginal discharge. Hx significant for abd cerclage and second trimester loss due to incompetent cervix x 3 w/ two failed McDonald Cerclages.      Past Medical History  Diagnosis Date  . Miscarriage 12/12  . No pertinent past medical history    Past Surgical History  Procedure Date  . Cervical cerclage 2009  . Cervical cerclage   . Abdominal cerclage 01/23/2012    Procedure: CERCLAGE ABDOMINAL;  Surgeon: Meriel Pica, MD;  Location: [facility];  Service: Gynecology;  Laterality: N/A;   History   Social History  . Marital Status: Single    Spouse Name: N/A    Number of Children: N/A  . Years of Education: N/A   Occupational History  . Not on file.   Social History Main Topics  . Smoking status: Never Smoker   . Smokeless tobacco: Never Used  . Alcohol Use: No     social  . Drug Use: No  . Sexually Active: Yes   Other Topics Concern  . Not on file   Social History Narrative  . No narrative on file   No current facility-administered medications on file prior to encounter.   Current Outpatient Prescriptions on File Prior to Encounter  Medication Sig Dispense Refill  . Prenatal Vit-Fe Fumarate-FA (PRENATAL MULTIVITAMIN) TABS Take 1  tablet by mouth daily.       Allergies  Allergen Reactions  . Nickel Rash    ROS: Pertinent items in HPI  OBJECTIVE Blood pressure 101/58, pulse 91, temperature 98.6 F (37 C), temperature source Oral, resp. rate 16, height 5\' 4"  (1.626 m), weight 60.782 kg (134 lb), last menstrual period 10/09/2011, SpO2 100.00%.  GENERAL: Well-developed, well-nourished female in no acute distress.  HEENT: Normocephalic, good dentition HEART: normal rate RESP: normal effort ABDOMEN: Soft, nontender, gravid, S=D. No UC's palpated EXTREMITIES: Nontender, no edema NEURO: Alert and oriented SPECULUM EXAM: NEFG, moderate amount of creamy, white, odorless discharge, no blood noted, cervix clean. Cerclage not seen. BIMANUAL: cervix long, closed, posterior; uterus nontender FHR pos.  LAB RESULTS Results for orders placed during the hospital encounter of 02/17/12 (from the past 24 hour(s))  URINALYSIS, ROUTINE W REFLEX MICROSCOPIC     Status: Abnormal   Collection Time   02/17/12  9:05 AM      Component Value Range   Color, Urine YELLOW  YELLOW    APPearance CLEAR  CLEAR    Specific Gravity, Urine 1.010  1.005 - 1.030    pH 7.5  5.0 - 8.0    Glucose, UA NEGATIVE  NEGATIVE (mg/dL)   Hgb urine dipstick NEGATIVE  NEGATIVE    Bilirubin Urine NEGATIVE  NEGATIVE    Ketones, ur NEGATIVE  NEGATIVE (mg/dL)   Protein, ur NEGATIVE  NEGATIVE (mg/dL)   Urobilinogen, UA 0.2  0.0 - 1.0 (mg/dL)   Nitrite NEGATIVE  NEGATIVE    Leukocytes, UA TRACE (*) NEGATIVE   URINE MICROSCOPIC-ADD ON     Status: Abnormal   Collection Time   02/17/12  9:05 AM      Component Value Range   Squamous Epithelial / LPF FEW (*) RARE    WBC, UA 0-2  <3 (WBC/hpf)  WET PREP, GENITAL     Status: Abnormal   Collection Time   02/17/12 10:23 AM      Component Value Range   Yeast Wet Prep HPF POC NONE SEEN  NONE SEEN    Trich, Wet Prep NONE SEEN  NONE SEEN    Clue Cells Wet Prep HPF POC NONE SEEN  NONE SEEN    WBC, Wet Prep HPF POC FEW  (*) NONE SEEN    Fern neg  Dr. Marcelle Overlie notified of pt complaints, cervical exam. Ordered amnisure.  Amnisure negative  IMAGING NA  ASSESSMENT 1. Vaginal discharge in pregnancy   2. Increased urine volume, likely due to resolution of peripheral edema w/ rest    PLAN D/C home per consult w/ Dr. Marcelle Overlie PLT precautions Follow-up Information    Follow up with Meriel Pica, MD on 02/17/2012. (or MAU as needed if symptoms worsen)    Contact information:   [address] [place] [address] [phone]         Medication List  As of 02/18/2012  1:32 PM   CONTINUE taking these medications         prenatal multivitamin Tabs           Sula Fetterly 02/17/2012 12:01 PM

## 2012-02-17 NOTE — MAU Note (Signed)
Patient has a cerclage in place.

## 2012-07-02 ENCOUNTER — Inpatient Hospital Stay (HOSPITAL_COMMUNITY)
Admission: AD | Admit: 2012-07-02 | Discharge: 2012-07-03 | Disposition: A | Discharge status: Home / Self Care | Payer: BC Managed Care – PPO | Source: Ambulatory Visit | Attending: Obstetrics and Gynecology | Admitting: Obstetrics and Gynecology

## 2012-07-02 ENCOUNTER — Encounter (HOSPITAL_COMMUNITY): Payer: Self-pay | Admitting: *Deleted

## 2012-07-02 DIAGNOSIS — O479 False labor, unspecified: Secondary | ICD-10-CM | POA: Insufficient documentation

## 2012-07-02 NOTE — MAU Note (Signed)
p reports having ctxs on and off since 1pm. More frequent earlier this today. Now ctx oare about every 10-15 min. Pt has abd cerclage in place and due to have c-section on 10/28

## 2012-07-03 ENCOUNTER — Encounter (HOSPITAL_COMMUNITY): Payer: Self-pay

## 2012-07-06 ENCOUNTER — Encounter (HOSPITAL_COMMUNITY)
Admission: RE | Admit: 2012-07-06 | Discharge: 2012-07-06 | Disposition: A | Discharge status: Home / Self Care | Payer: BC Managed Care – PPO | Source: Ambulatory Visit | Attending: Obstetrics and Gynecology | Admitting: Obstetrics and Gynecology

## 2012-07-06 ENCOUNTER — Encounter (HOSPITAL_COMMUNITY): Payer: Self-pay

## 2012-07-06 LAB — RPR: RPR Ser Ql: NONREACTIVE

## 2012-07-06 LAB — CBC
HCT: 36.5 % (ref 36.0–46.0)
Hemoglobin: 11.9 g/dL — ABNORMAL LOW (ref 12.0–15.0)
MCHC: 32.6 g/dL (ref 30.0–36.0)
RBC: 4.78 MIL/uL (ref 3.87–5.11)

## 2012-07-06 NOTE — H&P (Signed)
[name]  DICTATION # [id number] CSN# [id number]   Meriel Pica, MD 07/06/2012 2:06 PM

## 2012-07-06 NOTE — Patient Instructions (Addendum)
20 [name]  07/06/2012   Your procedure is scheduled on:  10/28  Enter through the Main Entrance of [facility] at 1130 AM.  Pick up the phone at the desk and dial [phone].   Call this number if you have problems the morning of surgery: [phone]   Remember:   Do not eat food:After Midnight.  Do not drink clear liquids: 4 Hours before arrival.  Take these medicines the morning of surgery with A SIP OF WATER: NA   Do not wear jewelry, make-up or nail polish.  Do not wear lotions, powders, or perfumes. You may wear deodorant.  Do not shave 48 hours prior to surgery.  Do not bring valuables to the hospital.  Contacts, dentures or bridgework may not be worn into surgery.  Leave suitcase in the car. After surgery it may be brought to your room.  For patients admitted to the hospital, checkout time is 11:00 AM the day of discharge.   Patients discharged the day of surgery will not be allowed to drive home.  Name and phone number of your driver: NA  Special Instructions: Shower using CHG 2 nights before surgery and the night before surgery.  If you shower the day of surgery use CHG.  Use special wash - you have one bottle of CHG for all showers.  You should use approximately 1/3 of the bottle for each shower.   Please read over the following fact sheets that you were given: MRSA Information

## 2012-07-07 NOTE — H&P (Signed)
[name], [name]                 ACCOUNT NO.:  [id number]  MEDICAL RECORD NO.:  [id number]  LOCATION:  SDC                           FACILITY:  [facility]  PHYSICIAN:  Duke Salvia. Marcelle Overlie, M.D.DATE OF BIRTH:  [birth year]  DATE OF ADMISSION:  07/06/2012 DATE OF DISCHARGE:  07/06/2012                             HISTORY & PHYSICAL   CHIEF COMPLAINT:  Primary cesarean section at term, abdominal cerclage.  HISTORY OF PRESENT ILLNESS:  A 21 year old, G5, P0-0-4-0.  This patient has been on weekly progesterone and had placement of abdominal cerclage at [redacted] weeks gestation.  She has done fine since that time, presents now for primary cesarean section at term secondary to the presence of abdominal cerclage.  Her 1 hour GTT was normal at 108.  Her GBS was positive.  PAST MEDICAL HISTORY:  Please see her [employer] form for details.  PHYSICAL EXAMINATION:  VITAL SIGNS:  Temp 98.2, blood pressure 120/78. HEENT:  Unremarkable. NECK:  Supple without masses. LUNGS:  Clear. CARDIOVASCULAR:  Regular rate and rhythm without murmurs, rubs, gallops. BREASTS:  Without masses. PELVIC:  Term fundal height, fetal heart rate 140.  Cervix was closed. EXTREMITIES AND NEUROLOGIC:  Unremarkable.  IMPRESSION:  Term pregnancy, abdominal cerclage.  PLAN:  Primary cesarean section, specific risks related to bleeding, infection, adjacent organ injury, transfusion, wound infection, phlebitis along with her expected recovery time reviewed.  She prefers to have the cerclage left intact for future pregnancies.     Doristine Shehan M. Marcelle Overlie, M.D.     RMH/MEDQ  D:  07/06/2012  T:  07/07/2012  Job:  [id number]

## 2012-07-07 NOTE — Pre-Procedure Instructions (Signed)
Cristela Blue, MD informed of Platelet count of 118, no further orders needed.

## 2012-07-09 LAB — MRSA CULTURE

## 2012-07-13 ENCOUNTER — Encounter (HOSPITAL_COMMUNITY): Payer: Self-pay | Admitting: *Deleted

## 2012-07-13 ENCOUNTER — Inpatient Hospital Stay (HOSPITAL_COMMUNITY): Payer: BC Managed Care – PPO

## 2012-07-13 ENCOUNTER — Encounter (HOSPITAL_COMMUNITY)
Admission: AD | Disposition: A | Discharge status: Home / Self Care | Payer: Self-pay | Source: Ambulatory Visit | Attending: Obstetrics and Gynecology

## 2012-07-13 ENCOUNTER — Encounter (HOSPITAL_COMMUNITY): Payer: Self-pay

## 2012-07-13 ENCOUNTER — Inpatient Hospital Stay (HOSPITAL_COMMUNITY)
Admission: AD | Admit: 2012-07-13 | Discharge: 2012-07-15 | DRG: 371 | Disposition: A | Discharge status: Home / Self Care | Payer: BC Managed Care – PPO | Source: Ambulatory Visit | Attending: Obstetrics and Gynecology | Admitting: Obstetrics and Gynecology

## 2012-07-13 ENCOUNTER — Inpatient Hospital Stay (HOSPITAL_COMMUNITY): Payer: BC Managed Care – PPO | Admitting: Anesthesiology

## 2012-07-13 ENCOUNTER — Encounter (HOSPITAL_COMMUNITY): Payer: Self-pay | Admitting: Anesthesiology

## 2012-07-13 DIAGNOSIS — O99892 Other specified diseases and conditions complicating childbirth: Principal | ICD-10-CM | POA: Diagnosis present

## 2012-07-13 DIAGNOSIS — O3430 Maternal care for cervical incompetence, unspecified trimester: Secondary | ICD-10-CM | POA: Diagnosis present

## 2012-07-13 DIAGNOSIS — Z22330 Carrier of Group B streptococcus: Secondary | ICD-10-CM

## 2012-07-13 SURGERY — Surgical Case
Anesthesia: Spinal | Site: Abdomen | Wound class: Clean Contaminated

## 2012-07-13 MED ORDER — DIPHENHYDRAMINE HCL 25 MG PO CAPS: 25.0000 mg | ORAL_CAPSULE | ORAL | Status: DC | PRN

## 2012-07-13 MED ORDER — HYDROMORPHONE HCL PF 1 MG/ML IJ SOLN: 0.2500 mg | INTRAMUSCULAR | Status: DC | PRN

## 2012-07-13 MED ORDER — DIPHENHYDRAMINE HCL 50 MG/ML IJ SOLN: 25.0000 mg | INTRAMUSCULAR | Status: DC | PRN

## 2012-07-13 MED ORDER — SIMETHICONE 80 MG PO CHEW
80.0000 mg | CHEWABLE_TABLET | Freq: Three times a day (TID) | ORAL | Status: DC
  Administered 2012-07-14 – 2012-07-15 (×4): 80 mg via ORAL

## 2012-07-13 MED ORDER — OXYTOCIN 10 UNIT/ML IJ SOLN
INTRAMUSCULAR | Status: AC
  Filled 2012-07-13: qty 1

## 2012-07-13 MED ORDER — SIMETHICONE 80 MG PO CHEW: 80.0000 mg | CHEWABLE_TABLET | ORAL | Status: DC | PRN

## 2012-07-13 MED ORDER — NALBUPHINE HCL 10 MG/ML IJ SOLN: 5.0000 mg | INTRAMUSCULAR | Status: DC | PRN

## 2012-07-13 MED ORDER — FENTANYL CITRATE 0.05 MG/ML IJ SOLN
INTRAMUSCULAR | Status: AC
  Filled 2012-07-13: qty 2

## 2012-07-13 MED ORDER — LACTATED RINGERS IV SOLN
INTRAVENOUS | Status: DC
  Administered 2012-07-13: 125 mL/h via INTRAVENOUS

## 2012-07-13 MED ORDER — EPHEDRINE SULFATE 50 MG/ML IJ SOLN
INTRAMUSCULAR | Status: DC | PRN
  Administered 2012-07-13: 10 mg via INTRAVENOUS

## 2012-07-13 MED ORDER — METOCLOPRAMIDE HCL 5 MG/ML IJ SOLN: 10.0000 mg | Freq: Three times a day (TID) | INTRAMUSCULAR | Status: DC | PRN

## 2012-07-13 MED ORDER — SCOPOLAMINE 1 MG/3DAYS TD PT72
MEDICATED_PATCH | TRANSDERMAL | Status: AC
  Administered 2012-07-13: 1.5 mg via TRANSDERMAL
  Filled 2012-07-13: qty 1

## 2012-07-13 MED ORDER — ONDANSETRON HCL 4 MG PO TABS: 4.0000 mg | ORAL_TABLET | ORAL | Status: DC | PRN

## 2012-07-13 MED ORDER — MORPHINE SULFATE 0.5 MG/ML IJ SOLN
INTRAMUSCULAR | Status: AC
  Filled 2012-07-13: qty 10

## 2012-07-13 MED ORDER — PHENYLEPHRINE HCL 10 MG/ML IJ SOLN
INTRAMUSCULAR | Status: AC | PRN
  Administered 2012-07-13 (×5): 40 ug via INTRAVENOUS

## 2012-07-13 MED ORDER — SENNOSIDES-DOCUSATE SODIUM 8.6-50 MG PO TABS
2.0000 | ORAL_TABLET | Freq: Every day | ORAL | Status: DC
  Administered 2012-07-14: 2 via ORAL

## 2012-07-13 MED ORDER — METOCLOPRAMIDE HCL 5 MG/ML IJ SOLN
INTRAMUSCULAR | Status: AC
  Filled 2012-07-13: qty 2

## 2012-07-13 MED ORDER — PRENATAL MULTIVITAMIN CH
1.0000 | ORAL_TABLET | Freq: Every day | ORAL | Status: DC
  Administered 2012-07-14 – 2012-07-15 (×2): 1 via ORAL
  Filled 2012-07-13 (×3): qty 1

## 2012-07-13 MED ORDER — SODIUM CHLORIDE 0.9 % IJ SOLN: 3.0000 mL | Freq: Two times a day (BID) | INTRAMUSCULAR | Status: DC

## 2012-07-13 MED ORDER — OXYTOCIN 10 UNIT/ML IJ SOLN
INTRAMUSCULAR | Status: AC
  Filled 2012-07-13: qty 4

## 2012-07-13 MED ORDER — ONDANSETRON HCL 4 MG/2ML IJ SOLN: 4.0000 mg | Freq: Three times a day (TID) | INTRAMUSCULAR | Status: DC | PRN

## 2012-07-13 MED ORDER — MEPERIDINE HCL 25 MG/ML IJ SOLN: 6.2500 mg | INTRAMUSCULAR | Status: DC | PRN

## 2012-07-13 MED ORDER — ONDANSETRON HCL 4 MG/2ML IJ SOLN: 4.0000 mg | INTRAMUSCULAR | Status: DC | PRN

## 2012-07-13 MED ORDER — IBUPROFEN 800 MG PO TABS
800.0000 mg | ORAL_TABLET | Freq: Three times a day (TID) | ORAL | Status: DC | PRN
  Administered 2012-07-14 – 2012-07-15 (×4): 800 mg via ORAL
  Filled 2012-07-13 (×5): qty 1

## 2012-07-13 MED ORDER — MORPHINE SULFATE 10 MG/ML IJ SOLN
INTRAMUSCULAR | Status: AC
  Filled 2012-07-13: qty 1

## 2012-07-13 MED ORDER — CEFAZOLIN SODIUM-DEXTROSE 2-3 GM-% IV SOLR
INTRAVENOUS | Status: AC
  Administered 2012-07-13: 2 g via INTRAVENOUS
  Filled 2012-07-13: qty 50

## 2012-07-13 MED ORDER — SODIUM CHLORIDE 0.9 % IV SOLN: 1.0000 ug/kg/h | INTRAVENOUS | Status: DC | PRN

## 2012-07-13 MED ORDER — MEASLES, MUMPS & RUBELLA VAC ~~LOC~~ INJ: 0.5000 mL | INJECTION | Freq: Once | SUBCUTANEOUS | Status: DC

## 2012-07-13 MED ORDER — SODIUM BICARBONATE 8.4 % IV SOLN
INTRAVENOUS | Status: AC
  Filled 2012-07-13: qty 50

## 2012-07-13 MED ORDER — OXYTOCIN 40 UNITS IN LACTATED RINGERS INFUSION - SIMPLE MED
INTRAVENOUS | Status: DC | PRN
  Administered 2012-07-13: 40 [IU] via INTRAVENOUS

## 2012-07-13 MED ORDER — KETOROLAC TROMETHAMINE 30 MG/ML IJ SOLN: 30.0000 mg | Freq: Four times a day (QID) | INTRAMUSCULAR | Status: DC | PRN

## 2012-07-13 MED ORDER — SODIUM CHLORIDE 0.9 % IJ SOLN: 3.0000 mL | INTRAMUSCULAR | Status: DC | PRN

## 2012-07-13 MED ORDER — SODIUM BICARBONATE 8.4 % IV SOLN
INTRAVENOUS | Status: AC | PRN
  Administered 2012-07-13: 10 mL via EPIDURAL

## 2012-07-13 MED ORDER — ZOLPIDEM TARTRATE 5 MG PO TABS: 5.0000 mg | ORAL_TABLET | Freq: Every evening | ORAL | Status: DC | PRN

## 2012-07-13 MED ORDER — SODIUM CHLORIDE 0.9 % IV SOLN: 250.0000 mL | INTRAVENOUS | Status: DC

## 2012-07-13 MED ORDER — LANOLIN HYDROUS EX OINT: 1.0000 "application " | TOPICAL_OINTMENT | CUTANEOUS | Status: DC | PRN

## 2012-07-13 MED ORDER — OXYCODONE-ACETAMINOPHEN 5-325 MG PO TABS
1.0000 | ORAL_TABLET | Freq: Four times a day (QID) | ORAL | Status: DC | PRN
  Administered 2012-07-14 (×2): 1 via ORAL
  Filled 2012-07-13: qty 2
  Filled 2012-07-13: qty 1

## 2012-07-13 MED ORDER — DIBUCAINE 1 % RE OINT: 1.0000 "application " | TOPICAL_OINTMENT | RECTAL | Status: DC | PRN

## 2012-07-13 MED ORDER — LIDOCAINE-EPINEPHRINE (PF) 2 %-1:200000 IJ SOLN
INTRAMUSCULAR | Status: AC
  Filled 2012-07-13: qty 20

## 2012-07-13 MED ORDER — IBUPROFEN 600 MG PO TABS: 600.0000 mg | ORAL_TABLET | Freq: Four times a day (QID) | ORAL | Status: DC | PRN

## 2012-07-13 MED ORDER — KETOROLAC TROMETHAMINE 30 MG/ML IJ SOLN: 30.0000 mg | Freq: Four times a day (QID) | INTRAMUSCULAR | Status: AC | PRN

## 2012-07-13 MED ORDER — PHENYLEPHRINE 40 MCG/ML (10ML) SYRINGE FOR IV PUSH (FOR BLOOD PRESSURE SUPPORT)
PREFILLED_SYRINGE | INTRAVENOUS | Status: AC
  Filled 2012-07-13: qty 5

## 2012-07-13 MED ORDER — NALOXONE HCL 0.4 MG/ML IJ SOLN: 0.4000 mg | INTRAMUSCULAR | Status: DC | PRN

## 2012-07-13 MED ORDER — ONDANSETRON HCL 4 MG/2ML IJ SOLN
4.0000 mg | Freq: Once | INTRAMUSCULAR | Status: AC
  Administered 2012-07-13: 4 mg via INTRAVENOUS
  Filled 2012-07-13: qty 2

## 2012-07-13 MED ORDER — DIPHENHYDRAMINE HCL 50 MG/ML IJ SOLN: 12.5000 mg | INTRAMUSCULAR | Status: DC | PRN

## 2012-07-13 MED ORDER — LACTATED RINGERS IV SOLN: INTRAVENOUS | Status: DC

## 2012-07-13 MED ORDER — CEFAZOLIN SODIUM-DEXTROSE 2-3 GM-% IV SOLR
2.0000 g | INTRAVENOUS | Status: AC
  Administered 2012-07-13: 2 g via INTRAVENOUS

## 2012-07-13 MED ORDER — MORPHINE SULFATE (PF) 0.5 MG/ML IJ SOLN
INTRAMUSCULAR | Status: AC | PRN
  Administered 2012-07-13: 4 mg via EPIDURAL

## 2012-07-13 MED ORDER — LACTATED RINGERS IV SOLN
INTRAVENOUS | Status: AC | PRN
  Administered 2012-07-13: 13:00:00 via INTRAVENOUS

## 2012-07-13 MED ORDER — METOCLOPRAMIDE HCL 5 MG/ML IJ SOLN
10.0000 mg | Freq: Three times a day (TID) | INTRAMUSCULAR | Status: DC | PRN
  Administered 2012-07-13: 10 mg via INTRAVENOUS

## 2012-07-13 MED ORDER — MENTHOL 3 MG MT LOZG: 1.0000 | LOZENGE | OROMUCOSAL | Status: DC | PRN

## 2012-07-13 MED ORDER — BISACODYL 10 MG RE SUPP: 10.0000 mg | Freq: Every day | RECTAL | Status: DC | PRN

## 2012-07-13 MED ORDER — ONDANSETRON HCL 4 MG/2ML IJ SOLN
INTRAMUSCULAR | Status: DC | PRN
  Administered 2012-07-13: 4 mg via INTRAVENOUS

## 2012-07-13 MED ORDER — SCOPOLAMINE 1 MG/3DAYS TD PT72: 1.0000 | MEDICATED_PATCH | Freq: Once | TRANSDERMAL | Status: DC

## 2012-07-13 MED ORDER — TETANUS-DIPHTH-ACELL PERTUSSIS 5-2.5-18.5 LF-MCG/0.5 IM SUSP: 0.5000 mL | Freq: Once | INTRAMUSCULAR | Status: DC

## 2012-07-13 MED ORDER — PHENYLEPHRINE HCL 10 MG/ML IJ SOLN
INTRAMUSCULAR | Status: DC | PRN
  Administered 2012-07-13 (×2): 40 ug via INTRAVENOUS

## 2012-07-13 MED ORDER — FLEET ENEMA 7-19 GM/118ML RE ENEM: 1.0000 | ENEMA | Freq: Every day | RECTAL | Status: DC | PRN

## 2012-07-13 MED ORDER — ONDANSETRON HCL 4 MG/2ML IJ SOLN
INTRAMUSCULAR | Status: AC
  Filled 2012-07-13: qty 2

## 2012-07-13 MED ORDER — DIPHENHYDRAMINE HCL 25 MG PO CAPS: 25.0000 mg | ORAL_CAPSULE | Freq: Four times a day (QID) | ORAL | Status: DC | PRN

## 2012-07-13 MED ORDER — WITCH HAZEL-GLYCERIN EX PADS: 1.0000 "application " | MEDICATED_PAD | CUTANEOUS | Status: DC | PRN

## 2012-07-13 MED ORDER — KETOROLAC TROMETHAMINE 60 MG/2ML IM SOLN
60.0000 mg | Freq: Once | INTRAMUSCULAR | Status: AC | PRN
  Filled 2012-07-13: qty 2

## 2012-07-13 MED ORDER — OXYTOCIN 10 UNIT/ML IJ SOLN
40.0000 [IU] | INTRAVENOUS | Status: AC | PRN
  Administered 2012-07-13: 40 [IU] via INTRAVENOUS

## 2012-07-13 MED ORDER — LACTATED RINGERS IV SOLN
INTRAVENOUS | Status: AC | PRN
  Administered 2012-07-13 (×2): via INTRAVENOUS

## 2012-07-13 MED ORDER — KETOROLAC TROMETHAMINE 60 MG/2ML IM SOLN: 60.0000 mg | Freq: Once | INTRAMUSCULAR | Status: AC | PRN

## 2012-07-13 MED ORDER — OXYTOCIN 40 UNITS IN LACTATED RINGERS INFUSION - SIMPLE MED: 62.5000 mL/h | INTRAVENOUS | Status: AC

## 2012-07-13 MED ORDER — SCOPOLAMINE 1 MG/3DAYS TD PT72
1.0000 | MEDICATED_PATCH | Freq: Once | TRANSDERMAL | Status: DC
  Administered 2012-07-13: 1.5 mg via TRANSDERMAL

## 2012-07-13 MED ORDER — EPHEDRINE 5 MG/ML INJ
INTRAVENOUS | Status: AC
  Filled 2012-07-13: qty 10

## 2012-07-13 SURGICAL SUPPLY — 31 items
APL SKNCLS STERI-STRIP NONHPOA (GAUZE/BANDAGES/DRESSINGS) ×1
BARRIER ADHS 3X4 INTERCEED (GAUZE/BANDAGES/DRESSINGS) ×2 IMPLANT
BENZOIN TINCTURE PRP APPL 2/3 (GAUZE/BANDAGES/DRESSINGS) ×2 IMPLANT
BRR ADH 4X3 ABS CNTRL BYND (GAUZE/BANDAGES/DRESSINGS) ×1
CLOTH BEACON ORANGE TIMEOUT ST (SAFETY) ×2 IMPLANT
DRAPE SURG 17X23 STRL (DRAPES) ×2 IMPLANT
DRESSING TELFA 8X3 (GAUZE/BANDAGES/DRESSINGS) ×2 IMPLANT
DRSG COVADERM 4X10 (GAUZE/BANDAGES/DRESSINGS) ×1 IMPLANT
DURAPREP 26ML APPLICATOR (WOUND CARE) ×2 IMPLANT
ELECT REM PT RETURN 9FT ADLT (ELECTROSURGICAL) ×2
ELECTRODE REM PT RTRN 9FT ADLT (ELECTROSURGICAL) ×1 IMPLANT
EXTRACTOR VACUUM M CUP 4 TUBE (SUCTIONS) IMPLANT
GAUZE SPONGE 4X4 12PLY STRL LF (GAUZE/BANDAGES/DRESSINGS) ×4 IMPLANT
GLOVE BIO SURGEON STRL SZ7 (GLOVE) ×4 IMPLANT
GOWN PREVENTION PLUS LG XLONG (DISPOSABLE) ×6 IMPLANT
KIT ABG SYR 3ML LUER SLIP (SYRINGE) IMPLANT
NEEDLE HYPO 25X5/8 SAFETYGLIDE (NEEDLE) ×2 IMPLANT
NS IRRIG 1000ML POUR BTL (IV SOLUTION) ×2 IMPLANT
PACK C SECTION WH (CUSTOM PROCEDURE TRAY) ×2 IMPLANT
PAD ABD 7.5X8 STRL (GAUZE/BANDAGES/DRESSINGS) ×2 IMPLANT
PAD OB MATERNITY 4.3X12.25 (PERSONAL CARE ITEMS) IMPLANT
SLEEVE SCD COMPRESS KNEE MED (MISCELLANEOUS) IMPLANT
STRIP CLOSURE SKIN 1/2X4 (GAUZE/BANDAGES/DRESSINGS) ×2 IMPLANT
SUT CHROMIC 0 CTX 36 (SUTURE) ×6 IMPLANT
SUT MON AB 4-0 PS1 27 (SUTURE) ×2 IMPLANT
SUT PDS AB 0 CT1 27 (SUTURE) ×4 IMPLANT
SUT VIC AB 3-0 CT1 27 (SUTURE) ×4
SUT VIC AB 3-0 CT1 TAPERPNT 27 (SUTURE) ×2 IMPLANT
TOWEL OR 17X24 6PK STRL BLUE (TOWEL DISPOSABLE) ×4 IMPLANT
TRAY FOLEY CATH 14FR (SET/KITS/TRAYS/PACK) ×2 IMPLANT
WATER STERILE IRR 1000ML POUR (IV SOLUTION) ×2 IMPLANT

## 2012-07-13 NOTE — Anesthesia Postprocedure Evaluation (Signed)
  Anesthesia Post-op Note  Patient: [name]  Procedure(s) Performed: Procedure(s) (LRB) with comments: CESAREAN SECTION (N/A) - Primary edc 07/19/12   Patient is awake, responsive, moving her legs, and has signs of resolution of her numbness. Pain and nausea are reasonably well controlled. Vital signs are stable and clinically acceptable. Oxygen saturation is clinically acceptable. There are no apparent anesthetic complications at this time. Patient is ready for discharge.

## 2012-07-13 NOTE — Transfer of Care (Signed)
Immediate Anesthesia Transfer of Care Note  Patient: [name]  Procedure(s) Performed: Procedure(s) (LRB) with comments: CESAREAN SECTION (N/A) - Primary edc 07/19/12  Patient Location: PACU  Anesthesia Type:Spinal  Level of Consciousness: awake, alert  and oriented  Airway & Oxygen Therapy: Patient Spontanous Breathing  Post-op Assessment: Report given to PACU RN and Post -op Vital signs reviewed and stable  Post vital signs: Reviewed and stable  Complications: No apparent anesthesia complications

## 2012-07-13 NOTE — Progress Notes (Signed)
The patient was re-examined with no change in status 

## 2012-07-13 NOTE — Op Note (Signed)
Preoperative diagnosis: Term pregnancy, abdominal cerclage  Postoperative diagnosis: Same  Procedure: MA low transverse cesarean section  Surgeon: Marcelle Overlie  Anesthesia: Spinal  EBL: 700 cc  I complications None  Specimens removed: Placenta, to labor and delivery  Procedure and findings patient taken the operating room after an adequate level of spinal anesthetic was obtained with the patient in left tilt position the abdomen prepped and draped in the usual fashion for cesarean section Foley catheter was positioned draining clear urine. Appropriate timeout for taken at that point.  Transverse incision made excising the O. scar this is carried down to the fascia which was incised and extended transversely. Rectus muscles divided in the midline peritoneum entered superiorly without incident and extended in a vertical fashion. The vesicouterine serosa was incised and the bladder was bluntly and sharply dissected below, the knot from the abdominal cerclage to be palpated below. Transverse incision made lower segment extended with blunt dissection clear fluid noted the patient then delivered of a healthy infant Apgars 9 and 9 the infant was suctioned cord clamped and passed the pediatric team for further care. The placenta was then delivered spontaneously, uterus exteriorized cavity wiped clean with a laparotomy pack closure transversely of 0 chromic in a locked fashion followed by Dimetane layer of 0 chromic. This is hemostatic bilateral tubes and ovaries were normal. The bladder flap area was intact and hemostatic. The bladder flap was closed with a 3-0 Vicryl suture. Prior to closure sponge denies precast reported as correct x2 Interceed in a T fashion was then placed across the uterine incision to prevent adhesions. The peritoneum closed the running 3-0 Vicryl suture. 3-0 Vicryl interrupted sutures used to reapproximate the rectus muscles in the midline a 0 PDS was then used from laterally to midline  on either side to close the fascia subcutaneous tissue was undermined and made hemostatic with the Bovie to allow for better closure this was hemostatic and fairly minimal, approximately centimeter was not closed separately. A 4-0 Monocryl subcuticular closure with Steri-Strips and a pressure dressing applied clear urine noted in the case she tolerated this well went to cover in good condition.  Dictated with [facility]  [name] M. Milana Obey.D.

## 2012-07-13 NOTE — Preoperative (Signed)
Beta Blockers   Reason not to administer Beta Blockers:Not Applicable 

## 2012-07-13 NOTE — Anesthesia Procedure Notes (Signed)
Anesthesia Regional Block: Narrative:       

## 2012-07-13 NOTE — Anesthesia Preprocedure Evaluation (Signed)
Anesthesia Evaluation  Patient identified by MRN, date of birth, ID band Patient awake    Reviewed: Allergy & Precautions, H&P , NPO status , Patient's Chart, lab work & pertinent test results  Airway Mallampati: I TM Distance: >3 FB Neck ROM: full    Dental No notable dental hx.    Pulmonary neg pulmonary ROS,  breath sounds clear to auscultation  Pulmonary exam normal       Cardiovascular Exercise Tolerance: Good negative cardio ROS  Rhythm:regular Rate:Normal     Neuro/Psych negative neurological ROS  negative psych ROS   GI/Hepatic negative GI ROS, Neg liver ROS,   Endo/Other  negative endocrine ROS  Renal/GU negative Renal ROS  negative genitourinary   Musculoskeletal negative musculoskeletal ROS (+)   Abdominal Normal abdominal exam  (+)   Peds  Hematology negative hematology ROS (+)   Anesthesia Other Findings   Reproductive/Obstetrics (+) Pregnancy (14 weeks, h/o cervical incompetence)                           Anesthesia Physical  Anesthesia Plan  ASA: II  Anesthesia Plan: Spinal   Post-op Pain Management:    Induction:   Airway Management Planned:   Additional Equipment:   Intra-op Plan:   Post-operative Plan:   Informed Consent: I have reviewed the patients History and Physical, chart, labs and discussed the procedure including the risks, benefits and alternatives for the proposed anesthesia with the patient or authorized representative who has indicated his/her understanding and acceptance.     Plan Discussed with: CRNA and Surgeon  Anesthesia Plan Comments:         Anesthesia Quick Evaluation

## 2012-07-13 NOTE — Anesthesia Preprocedure Evaluation (Signed)
Anesthesia Evaluation  Patient identified by MRN, date of birth, ID band Patient awake    Reviewed: Allergy & Precautions, H&P , NPO status , Patient's Chart, lab work & pertinent test results  Airway Mallampati: I TM Distance: >3 FB Neck ROM: full    Dental No notable dental hx.    Pulmonary neg pulmonary ROS,  breath sounds clear to auscultation  Pulmonary exam normal       Cardiovascular Exercise Tolerance: Good negative cardio ROS  Rhythm:regular Rate:Normal     Neuro/Psych negative neurological ROS  negative psych ROS   GI/Hepatic negative GI ROS, Neg liver ROS,   Endo/Other  negative endocrine ROS  Renal/GU negative Renal ROS  negative genitourinary   Musculoskeletal negative musculoskeletal ROS (+)   Abdominal Normal abdominal exam  (+)   Peds  Hematology negative hematology ROS (+)   Anesthesia Other Findings   Reproductive/Obstetrics (+) Pregnancy (14 weeks, h/o cervical incompetence)                           Anesthesia Physical  Anesthesia Plan  ASA: II  Anesthesia Plan: Spinal   Post-op Pain Management:    Induction:   Airway Management Planned:   Additional Equipment:   Intra-op Plan:   Post-operative Plan:   Informed Consent: I have reviewed the patients History and Physical, chart, labs and discussed the procedure including the risks, benefits and alternatives for the proposed anesthesia with the patient or authorized representative who has indicated his/her understanding and acceptance.     Plan Discussed with: CRNA and Surgeon  Anesthesia Plan Comments:         Anesthesia Quick Evaluation  

## 2012-07-14 ENCOUNTER — Encounter (HOSPITAL_COMMUNITY): Payer: Self-pay | Admitting: Obstetrics and Gynecology

## 2012-07-14 LAB — CBC
MCV: 75.6 fL — ABNORMAL LOW (ref 78.0–100.0)
Platelets: 167 10*3/uL (ref 150–400)
RDW: 14 % (ref 11.5–15.5)
WBC: 11.5 10*3/uL — ABNORMAL HIGH (ref 4.0–10.5)

## 2012-07-14 NOTE — Addendum Note (Signed)
Addendum  created 07/14/12 1140 by Shanon Payor, CRNA   Modules edited:Charges VN, Notes Section

## 2012-07-14 NOTE — Anesthesia Postprocedure Evaluation (Signed)
  Anesthesia Post-op Note  Patient: [name]  Procedure(s) Performed: Procedure(s) (LRB) with comments: CESAREAN SECTION (N/A) - Primary edc 07/19/12  Patient Location: Mother/Baby  Anesthesia Type:Spinal  Level of Consciousness: awake, alert  and oriented  Airway and Oxygen Therapy: Patient Spontanous Breathing  Post-op Pain: none  Post-op Assessment: Post-op Vital signs reviewed and Patient's Cardiovascular Status Stable  Post-op Vital Signs: Reviewed and stable  Complications: No apparent anesthesia complications

## 2012-07-14 NOTE — Progress Notes (Signed)
Subjective: Postpartum Day 1: Cesarean Delivery Patient reports tolerating PO.    Objective: Vital signs in last 24 hours: Temp:  [97.3 F (36.3 C)-99.1 F (37.3 C)] 98.9 F (37.2 C) (10/29 0440) Pulse Rate:  [64-92] 82  (10/29 0440) Resp:  [16-21] 20  (10/29 0440) BP: (107-138)/(42-84) 132/82 mmHg (10/29 0440) SpO2:  [96 %-100 %] 100 % (10/29 0440) Weight:  [81.647 kg (180 lb)] 81.647 kg (180 lb) (10/28 1620)  Physical Exam:  General: alert and cooperative Lochia: appropriate Uterine Fundus: firm Incision: abd dressing noted with old drainage noted on bandage DVT Evaluation: No evidence of DVT seen on physical exam. No significant calf/ankle edema. Foley removed, has not voided at the time of rounding BS sluggish   Basename 07/14/12 0535  HGB 10.4*  HCT 31.0*    Assessment/Plan: Status post Cesarean section. Doing well postoperatively.  Continue current care.  Abisai Coble G 07/14/2012, 8:09 AM

## 2012-07-15 LAB — BIRTH TISSUE RECOVERY COLLECTION (PLACENTA DONATION)

## 2012-07-15 MED ORDER — IBUPROFEN 800 MG PO TABS: 800.0000 mg | ORAL_TABLET | Freq: Three times a day (TID) | ORAL | Status: DC | PRN

## 2012-07-15 MED ORDER — OXYCODONE-ACETAMINOPHEN 5-325 MG PO TABS: 1.0000 | ORAL_TABLET | Freq: Four times a day (QID) | ORAL | Status: DC | PRN

## 2012-07-15 NOTE — Progress Notes (Signed)
SW met with parents in MOB's first floor room to see how they are doing.  SW knows MOB from past losses.  MOB states she was looking for SW last week because she wanted SW to see her pregnant.  She states she and baby are doing well and thanked SW for coming to see her and baby.  FOB states he is doing well also.  They state no questions or needs at this time.  SW offered support and congratulated parents.

## 2012-07-15 NOTE — Progress Notes (Signed)
Subjective: Postpartum Day 2: Cesarean Delivery Patient reports tolerating PO and no problems voiding.    Objective: Vital signs in last 24 hours: Temp:  [98 F (36.7 C)-98.3 F (36.8 C)] 98.3 F (36.8 C) (10/30 0530) Pulse Rate:  [66-79] 79  (10/30 0530) Resp:  [18-20] 18  (10/30 0530) BP: (101-113)/(60-73) 101/60 mmHg (10/30 0530)  Physical Exam:  General: alert and cooperative Lochia: appropriate Uterine Fundus: firm Incision: healing well DVT Evaluation: No evidence of DVT seen on physical exam. Negative Homan's sign. No cords or calf tenderness. No significant calf/ankle edema.   Basename 07/14/12 0535  HGB 10.4*  HCT 31.0*    Assessment/Plan: Status post Cesarean section. Doing well postoperatively.  Discharge home with standard precautions and return to clinic in 1 week.  Cohen Doleman G 07/15/2012, 8:51 AM

## 2012-07-15 NOTE — Discharge Summary (Signed)
Obstetric Discharge Summary Reason for Admission: cesarean section Prenatal Procedures: ultrasound Intrapartum Procedures: cesarean: low cervical, transverse Postpartum Procedures: none Complications-Operative and Postpartum: none Hemoglobin  Date Value Range Status  07/14/2012 10.4* 12.0 - 15.0 g/dL Final     HCT  Date Value Range Status  07/14/2012 31.0* 36.0 - 46.0 % Final    Physical Exam:  General: alert and cooperative Lochia: appropriate Uterine Fundus: firm Incision: healing well DVT Evaluation: No evidence of DVT seen on physical exam. Negative Homan's sign. No cords or calf tenderness. No significant calf/ankle edema.  Discharge Diagnoses: Term Pregnancy-delivered  Discharge Information: Date: 07/15/2012 Activity: pelvic rest Diet: routine Medications: PNV, Ibuprofen and Percocet Condition: stable Instructions: refer to practice specific booklet Discharge to: home   Newborn Data: Live born female  Birth Weight: 6 lb 5.2 oz (2870 g) APGAR: 9, 9  Home with mother.  Ademola Vert G 07/15/2012, 8:56 AM

## 2014-07-18 ENCOUNTER — Encounter (HOSPITAL_COMMUNITY): Payer: Self-pay | Admitting: Obstetrics and Gynecology

## 2016-09-10 ENCOUNTER — Ambulatory Visit (INDEPENDENT_AMBULATORY_CARE_PROVIDER_SITE_OTHER): Payer: BC Managed Care – PPO | Admitting: Family Medicine

## 2016-09-10 VITALS — BP 116/60 | HR 98 | Temp 100.3°F | Resp 16 | Ht 64.0 in | Wt 135.0 lb

## 2016-09-10 DIAGNOSIS — J020 Streptococcal pharyngitis: Secondary | ICD-10-CM

## 2016-09-10 DIAGNOSIS — H9202 Otalgia, left ear: Secondary | ICD-10-CM

## 2016-09-10 DIAGNOSIS — J029 Acute pharyngitis, unspecified: Secondary | ICD-10-CM | POA: Diagnosis not present

## 2016-09-10 LAB — POCT RAPID STREP A (OFFICE): RAPID STREP A SCREEN: POSITIVE — AB

## 2016-09-10 MED ORDER — AMOXICILLIN 875 MG PO TABS: 875.0000 mg | ORAL_TABLET | Freq: Two times a day (BID) | ORAL | 0 refills | Status: DC

## 2016-09-10 NOTE — Patient Instructions (Addendum)
Drink plenty of fluids and get enough rest. If not feeling better by Thursday usual work excuse and stay off work.  Tylenol 500 mg (acetaminophen) 2 pills 3 times daily as needed for fever and body aches and/or ibuprofen (Advil) 2 or 3 pills 3 times daily (400- 600 mg total each dose)  Return if needed  Take amoxicillin 875 mg one twice daily   Strep Throat Strep throat is an infection of the throat. It is caused by germs. Strep throat spreads from person to person because of coughing, sneezing, or close contact. Follow these instructions at home: Medicines  Take over-the-counter and prescription medicines only as told by your doctor.  Take your antibiotic medicine as told by your doctor. Do not stop taking the medicine even if you feel better.  Have family members who also have a sore throat or fever go to a doctor. Eating and drinking  Do not share food, drinking cups, or personal items.  Try eating soft foods until your sore throat feels better.  Drink enough fluid to keep your pee (urine) clear or pale yellow. General instructions  Rinse your mouth (gargle) with a salt-water mixture 3-4 times per day or as needed. To make a salt-water mixture, stir -1 tsp of salt into 1 cup of warm water.  Make sure that all people in your house wash their hands well.  Rest.  Stay home from school or work until you have been taking antibiotics for 24 hours.  Keep all follow-up visits as told by your doctor. This is important. Contact a doctor if:  Your neck keeps getting bigger.  You get a rash, cough, or earache.  You cough up thick liquid that is green, yellow-brown, or bloody.  You have pain that does not get better with medicine.  Your problems get worse instead of getting better.  You have a fever. Get help right away if:  You throw up (vomit).  You get a very bad headache.  You neck hurts or it feels stiff.  You have chest pain or you are short of breath.  You  have drooling, very bad throat pain, or changes in your voice.  Your neck is swollen or the skin gets red and tender.  Your mouth is dry or you are peeing less than normal.  You keep feeling more tired or it is hard to wake up.  Your joints are red or they hurt. This information is not intended to replace advice given to you by your health care provider. Make sure you discuss any questions you have with your health care provider. Document Released: 02/19/2008 Document Revised: 05/01/2016 Document Reviewed: 12/26/2014 Elsevier Interactive Patient Education  2017 [employer]   IF you received an x-ray today, you will receive an invoice from [facility]. Please contact [facility] at [phone] with questions or concerns regarding your invoice.   IF you received labwork today, you will receive an invoice from [facility]. Please contact [facility] at [phone] with questions or concerns regarding your invoice.   Our billing staff will not be able to assist you with questions regarding bills from these companies.  You will be contacted with the lab results as soon as they are available. The fastest way to get your results is to activate your My Chart account. Instructions are located on the last page of this paperwork. If you have not heard from us regarding the results in 2 weeks, please contact this office.

## 2016-09-10 NOTE — Progress Notes (Signed)
Patient ID: [name], female    DOB: 1991/01/03  Age: 25 y.o. MRN: [id number]  Chief Complaint  Patient presents with  . Sore Throat    X 1WK  . Sinusitis    x 1 wk  . Cough  . Ear Pain    left/ x 1 wk    Subjective:   25 year old lady who has been having problems over the last week or so. She's had some sinus congestion. She said sore throat which is now just settled on the left side of her throat over the last week. She has pain in her left ear she has a little cough. Some mucus production. No documented fevers though she may have had one. She does not smoke. Last menstrual period was last Monday. She is not pregnant.  Current allergies, medications, problem list, past/family and social histories reviewed.  Objective:  BP 116/60   Pulse 98   Temp 100.3 F (37.9 C) (Oral)   Resp 16   Ht 5\' 4"  (1.626 m)   Wt 135 lb (61.2 kg)   LMP 09/02/2016   SpO2 99%   BMI 23.17 kg/m   Pleasant young lady, alert and oriented. TMs are normal. Throat mildly erythematous on the left tonsil, but primarily the left tonsil is swollen considerably larger than the right. She is tender on the left submandibular angle with nodal enlargement. Chest is clear to auscultation. Heart regular without murmur. TMs are normal.  Assessment & Plan:   Assessment: 1. Streptococcal sore throat   2. Sore throat   3. Otalgia, left       Plan: Check the strep test and proceed from there.  Results for orders placed or performed in visit on 09/10/16  POCT rapid strep A  Result Value Ref Range   Rapid Strep A Screen Positive (A) Negative    Will treat for strep.     Patient Instructions   Drink plenty of fluids and get enough rest. If not feeling better by Thursday usual work excuse and stay off work.  Tylenol 500 mg (acetaminophen) 2 pills 3 times daily as needed for fever and body aches and/or ibuprofen (Advil) 2 or 3 pills 3 times daily (400- 600 mg total each dose)  Return if needed  Take  amoxicillin 875 mg one twice daily    IF you received an x-ray today, you will receive an invoice from [facility]. Please contact [facility] at [phone] with questions or concerns regarding your invoice.   IF you received labwork today, you will receive an invoice from [facility]. Please contact [facility] at [phone] with questions or concerns regarding your invoice.   Our billing staff will not be able to assist you with questions regarding bills from these companies.  You will be contacted with the lab results as soon as they are available. The fastest way to get your results is to activate your My Chart account. Instructions are located on the last page of this paperwork. If you have not heard from us regarding the results in 2 weeks, please contact this office.         Return if symptoms worsen or fail to improve.   HOPPER,DAVID, MD 09/10/2016

## 2016-09-12 LAB — CULTURE, GROUP A STREP: STREP A CULTURE: POSITIVE — AB

## 2016-09-12 NOTE — Addendum Note (Signed)
Addended by: Isaac BlissGALLOWAY, Nile Prisk J on: 09/12/2016 10:46 AM   Modules accepted: Kipp BroodSmartSet

## 2017-02-22 ENCOUNTER — Ambulatory Visit (INDEPENDENT_AMBULATORY_CARE_PROVIDER_SITE_OTHER): Payer: BC Managed Care – PPO | Admitting: Physician Assistant

## 2017-02-22 ENCOUNTER — Encounter: Payer: Self-pay | Admitting: Physician Assistant

## 2017-02-22 VITALS — BP 110/67 | HR 99 | Temp 99.1°F | Resp 20 | Ht 64.0 in | Wt 136.4 lb

## 2017-02-22 DIAGNOSIS — Z130 Encounter for screening for diseases of the blood and blood-forming organs and certain disorders involving the immune mechanism: Secondary | ICD-10-CM

## 2017-02-22 DIAGNOSIS — Z124 Encounter for screening for malignant neoplasm of cervix: Secondary | ICD-10-CM | POA: Diagnosis not present

## 2017-02-22 DIAGNOSIS — N898 Other specified noninflammatory disorders of vagina: Secondary | ICD-10-CM

## 2017-02-22 LAB — POCT WET + KOH PREP
TRICH BY WET PREP: ABSENT
Yeast by KOH: ABSENT
Yeast by wet prep: ABSENT

## 2017-02-22 MED ORDER — FLUCONAZOLE 150 MG PO TABS: 150.0000 mg | ORAL_TABLET | Freq: Once | ORAL | 0 refills | Status: AC

## 2017-02-22 NOTE — Patient Instructions (Signed)
     IF you received an x-ray today, you will receive an invoice from [facility]. Please contact  Radiology at [phone] with questions or concerns regarding your invoice.   IF you received labwork today, you will receive an invoice from [facility]. Please contact [facility] at [phone] with questions or concerns regarding your invoice.   Our billing staff will not be able to assist you with questions regarding bills from these companies.  You will be contacted with the lab results as soon as they are available. The fastest way to get your results is to activate your My Chart account. Instructions are located on the last page of this paperwork. If you have not heard from us regarding the results in 2 weeks, please contact this office.     

## 2017-02-22 NOTE — Progress Notes (Signed)
    02/22/2017 10:50 AM   DOB: Feb 15, 1991 / MRN: [id number]  SUBJECTIVE:  [name] is a 26 y.o. female presenting for vaginal irritation and itchiness mostly on the inside.  Complains of abnormal discharge that is malodrours and fishy that is white, and "liquidy." No urinary difficulty today. The discharge has been present for two days now. She is sexually active with one female for "lilke a year." No new sexual partners. No deusch, oils or gels. Has a history of bacterial vaginosis.  No history of yeast infection.  No ABX in the last four week. No IV drug use.  She has tried nothing for this.   Normal menstrual cycle will last 5 to 7 days. Heaviest bleeding is 2-3rd day.  She will go through 4-5 tampons on these days.   She is allergic to nickel.   She  has a past medical history of Miscarriage (12/12) and No pertinent past medical history.    She  reports that she has never smoked. She has never used smokeless tobacco. She reports that she does not drink alcohol or use drugs. She  reports that she currently engages in sexual activity. The patient  has a past surgical history that includes Cervical cerclage (2009); Cervical cerclage; Abdominal cerclage (01/23/2012); No past surgeries; and Cesarean section (07/13/2012).  Her family history includes Hypertension in her mother.  Review of Systems  Gastrointestinal: Negative for abdominal pain, constipation and diarrhea.  Genitourinary: Negative for dysuria, frequency and urgency.    The problem list and medications were reviewed and updated by myself where necessary and exist elsewhere in the encounter.   OBJECTIVE:  BP 110/67   Pulse 99   Temp 99.1 F (37.3 C) (Oral)   Resp 20   Ht 5\' 4"  (1.626 m)   Wt 136 lb 6 oz (61.9 kg)   LMP 02/04/2017   SpO2 100%   BMI 23.41 kg/m   Physical Exam  Constitutional: She is active.  Non-toxic appearance.  Cardiovascular: Normal rate.   Pulmonary/Chest: Effort normal. No tachypnea.    Genitourinary: Uterus normal. Vaginal discharge (thick, white, non malodorous ) found.  Neurological: She is alert.  Skin: Skin is warm and dry. She is not diaphoretic. No pallor.    No results found for this or any previous visit (from the past 72 hour(s)).  No results found.  ASSESSMENT AND PLAN:  [name] was seen today for vaginal irritation.  Diagnoses and all orders for this visit:  Vaginal discharge: Fluconazole as her exam and symptoms are consistent with yeast vaginitis. Other screenings out and will treat appropriately.  If negative and she remains symptomatic will refer to Dr. Lily PeerFernandez in gyn. -     HIV antibody -     RPR  Screening for cervical cancer -     Pap IG, CT/NG NAA, and HPV (high risk) -     POCT Wet + KOH Prep  Screening, iron deficiency anemia -     CBC    The patient is advised to call or return to clinic if she does not see an improvement in symptoms, or to seek the care of the closest emergency department if she worsens with the above plan.   Deliah BostonMichael Britten Seyfried, MHS, PA-C [facility] 02/22/2017 10:50 AM

## 2017-02-23 LAB — CBC
Hematocrit: 36.9 % (ref 34.0–46.6)
Hemoglobin: 11.5 g/dL (ref 11.1–15.9)
MCH: 24.5 pg — ABNORMAL LOW (ref 26.6–33.0)
MCHC: 31.2 g/dL — ABNORMAL LOW (ref 31.5–35.7)
MCV: 79 fL (ref 79–97)
PLATELETS: 254 10*3/uL (ref 150–379)
RBC: 4.7 x10E6/uL (ref 3.77–5.28)
RDW: 16.3 % — AB (ref 12.3–15.4)
WBC: 5.9 10*3/uL (ref 3.4–10.8)

## 2017-02-23 LAB — HIV ANTIBODY (ROUTINE TESTING W REFLEX): HIV Screen 4th Generation wRfx: NONREACTIVE

## 2017-02-23 LAB — RPR: RPR: NONREACTIVE

## 2017-02-27 ENCOUNTER — Telehealth: Payer: Self-pay

## 2017-02-27 LAB — PAP IG, CT-NG NAA, HPV HIGH-RISK
Chlamydia, Nuc. Acid Amp: NEGATIVE
GONOCOCCUS BY NUCLEIC ACID AMP: NEGATIVE
HPV, high-risk: NEGATIVE
PAP Smear Comment: 0

## 2017-02-27 NOTE — Telephone Encounter (Signed)
Call placed to patient per provider request. Patient identified by name and date of birth. Results given to patient, verbalized understanding. Patient states that she is no longer having symptoms, thanked office for the call./ [facility]

## 2017-02-27 NOTE — Telephone Encounter (Signed)
-----   Message from Ofilia NeasMichael L Clark, PA-C sent at 02/27/2017 10:02 AM EDT ----- Michelle FerraraHi [name].  This patients pap is negative and did actually show that she was positive for yeast, which I treated her for despite a negative wet prep given her symptoms and exam.  Please let her know via phone call or letter and if she is still having symptoms we can see her back. We will need to repeat her pap in 3 years. Thank you.  Deliah BostonMichael Clark, MS, PA-C 10:01 AM, 02/27/2017
